# Patient Record
Sex: Male | Born: 1935 | Race: White | Hispanic: No | Marital: Married | Smoking: Former smoker
Health system: Southern US, Community
[De-identification: ages and names within clinical notes are randomized; demographics above are authoritative.]

## PROBLEM LIST (undated history)

## (undated) DIAGNOSIS — N400 Enlarged prostate without lower urinary tract symptoms: Secondary | ICD-10-CM

## (undated) DIAGNOSIS — R319 Hematuria, unspecified: Secondary | ICD-10-CM

## (undated) DIAGNOSIS — E559 Vitamin D deficiency, unspecified: Secondary | ICD-10-CM

## (undated) DIAGNOSIS — G512 Melkersson's syndrome: Secondary | ICD-10-CM

## (undated) DIAGNOSIS — C719 Malignant neoplasm of brain, unspecified: Secondary | ICD-10-CM

## (undated) DIAGNOSIS — E785 Hyperlipidemia, unspecified: Secondary | ICD-10-CM

## (undated) DIAGNOSIS — M810 Age-related osteoporosis without current pathological fracture: Secondary | ICD-10-CM

## (undated) DIAGNOSIS — I10 Essential (primary) hypertension: Secondary | ICD-10-CM

## (undated) DIAGNOSIS — G629 Polyneuropathy, unspecified: Secondary | ICD-10-CM

## (undated) DIAGNOSIS — R569 Unspecified convulsions: Secondary | ICD-10-CM

## (undated) DIAGNOSIS — C3490 Malignant neoplasm of unspecified part of unspecified bronchus or lung: Secondary | ICD-10-CM

## (undated) DIAGNOSIS — L719 Rosacea, unspecified: Secondary | ICD-10-CM

## (undated) DIAGNOSIS — D472 Monoclonal gammopathy: Secondary | ICD-10-CM

## (undated) HISTORY — PX: CATARACT EXTRACTION: SUR2

## (undated) HISTORY — DX: Unspecified convulsions: R56.9

## (undated) HISTORY — DX: Rosacea, unspecified: L71.9

## (undated) HISTORY — DX: Essential (primary) hypertension: I10

## (undated) HISTORY — DX: Malignant neoplasm of unspecified part of unspecified bronchus or lung: C34.90

## (undated) HISTORY — DX: Monoclonal gammopathy: D47.2

## (undated) HISTORY — DX: Polyneuropathy, unspecified: G62.9

## (undated) HISTORY — DX: Hematuria, unspecified: R31.9

## (undated) HISTORY — DX: Vitamin D deficiency, unspecified: E55.9

## (undated) HISTORY — DX: Melkersson's syndrome: G51.2

## (undated) HISTORY — DX: Hyperlipidemia, unspecified: E78.5

## (undated) HISTORY — DX: Age-related osteoporosis without current pathological fracture: M81.0

## (undated) HISTORY — DX: Malignant neoplasm of brain, unspecified: C71.9

## (undated) HISTORY — DX: Benign prostatic hyperplasia without lower urinary tract symptoms: N40.0

---
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
## 1994-06-04 HISTORY — PX: OTHER SURGICAL HISTORY: SHX169

## 1998-12-08 ENCOUNTER — Ambulatory Visit (HOSPITAL_COMMUNITY): Admission: RE | Admit: 1998-12-08 | Discharge: 1998-12-08 | Payer: Self-pay | Admitting: Neurosurgery

## 1998-12-08 ENCOUNTER — Encounter: Payer: Self-pay | Admitting: Neurosurgery

## 1999-06-07 ENCOUNTER — Encounter: Admission: RE | Admit: 1999-06-07 | Discharge: 1999-06-07 | Payer: Self-pay | Admitting: Thoracic Surgery

## 1999-06-07 ENCOUNTER — Encounter: Payer: Self-pay | Admitting: Thoracic Surgery

## 1999-12-20 ENCOUNTER — Encounter: Admission: RE | Admit: 1999-12-20 | Discharge: 1999-12-20 | Payer: Self-pay | Admitting: Thoracic Surgery

## 1999-12-20 ENCOUNTER — Encounter: Payer: Self-pay | Admitting: Thoracic Surgery

## 2000-06-26 ENCOUNTER — Encounter: Payer: Self-pay | Admitting: Thoracic Surgery

## 2000-06-26 ENCOUNTER — Encounter: Admission: RE | Admit: 2000-06-26 | Discharge: 2000-06-26 | Payer: Self-pay | Admitting: Thoracic Surgery

## 2000-12-24 ENCOUNTER — Encounter: Admission: RE | Admit: 2000-12-24 | Discharge: 2000-12-24 | Payer: Self-pay | Admitting: Thoracic Surgery

## 2000-12-24 ENCOUNTER — Encounter: Payer: Self-pay | Admitting: Thoracic Surgery

## 2001-07-02 ENCOUNTER — Encounter: Payer: Self-pay | Admitting: Thoracic Surgery

## 2001-07-02 ENCOUNTER — Encounter: Admission: RE | Admit: 2001-07-02 | Discharge: 2001-07-02 | Payer: Self-pay | Admitting: Thoracic Surgery

## 2001-12-31 ENCOUNTER — Encounter: Payer: Self-pay | Admitting: Thoracic Surgery

## 2001-12-31 ENCOUNTER — Encounter: Admission: RE | Admit: 2001-12-31 | Discharge: 2001-12-31 | Payer: Self-pay | Admitting: Thoracic Surgery

## 2002-07-03 ENCOUNTER — Encounter: Payer: Self-pay | Admitting: Thoracic Surgery

## 2002-07-03 ENCOUNTER — Encounter: Admission: RE | Admit: 2002-07-03 | Discharge: 2002-07-03 | Payer: Self-pay | Admitting: Thoracic Surgery

## 2005-04-05 HISTORY — PX: LUNG SURGERY: SHX703

## 2011-06-08 DIAGNOSIS — L259 Unspecified contact dermatitis, unspecified cause: Secondary | ICD-10-CM | POA: Diagnosis not present

## 2011-07-06 DIAGNOSIS — L259 Unspecified contact dermatitis, unspecified cause: Secondary | ICD-10-CM | POA: Diagnosis not present

## 2011-08-03 DIAGNOSIS — L259 Unspecified contact dermatitis, unspecified cause: Secondary | ICD-10-CM | POA: Diagnosis not present

## 2011-08-30 DIAGNOSIS — L259 Unspecified contact dermatitis, unspecified cause: Secondary | ICD-10-CM | POA: Diagnosis not present

## 2011-08-30 DIAGNOSIS — L820 Inflamed seborrheic keratosis: Secondary | ICD-10-CM | POA: Diagnosis not present

## 2011-09-19 DIAGNOSIS — Z961 Presence of intraocular lens: Secondary | ICD-10-CM | POA: Diagnosis not present

## 2011-09-19 DIAGNOSIS — D3120 Benign neoplasm of unspecified retina: Secondary | ICD-10-CM | POA: Diagnosis not present

## 2011-09-19 DIAGNOSIS — H02429 Myogenic ptosis of unspecified eyelid: Secondary | ICD-10-CM | POA: Diagnosis not present

## 2011-09-19 DIAGNOSIS — H04229 Epiphora due to insufficient drainage, unspecified lacrimal gland: Secondary | ICD-10-CM | POA: Diagnosis not present

## 2011-09-28 DIAGNOSIS — L259 Unspecified contact dermatitis, unspecified cause: Secondary | ICD-10-CM | POA: Diagnosis not present

## 2011-11-02 DIAGNOSIS — L820 Inflamed seborrheic keratosis: Secondary | ICD-10-CM | POA: Diagnosis not present

## 2011-11-02 DIAGNOSIS — L259 Unspecified contact dermatitis, unspecified cause: Secondary | ICD-10-CM | POA: Diagnosis not present

## 2011-11-13 DIAGNOSIS — E785 Hyperlipidemia, unspecified: Secondary | ICD-10-CM | POA: Diagnosis not present

## 2011-11-13 DIAGNOSIS — C3490 Malignant neoplasm of unspecified part of unspecified bronchus or lung: Secondary | ICD-10-CM | POA: Diagnosis not present

## 2011-11-13 DIAGNOSIS — R05 Cough: Secondary | ICD-10-CM | POA: Diagnosis not present

## 2011-11-13 DIAGNOSIS — I10 Essential (primary) hypertension: Secondary | ICD-10-CM | POA: Diagnosis not present

## 2011-11-19 DIAGNOSIS — Z01818 Encounter for other preprocedural examination: Secondary | ICD-10-CM | POA: Diagnosis not present

## 2011-11-20 DIAGNOSIS — C3490 Malignant neoplasm of unspecified part of unspecified bronchus or lung: Secondary | ICD-10-CM | POA: Diagnosis not present

## 2011-11-20 DIAGNOSIS — R911 Solitary pulmonary nodule: Secondary | ICD-10-CM | POA: Diagnosis not present

## 2011-11-29 DIAGNOSIS — L241 Irritant contact dermatitis due to oils and greases: Secondary | ICD-10-CM | POA: Diagnosis not present

## 2011-12-21 DIAGNOSIS — L259 Unspecified contact dermatitis, unspecified cause: Secondary | ICD-10-CM | POA: Diagnosis not present

## 2012-01-18 DIAGNOSIS — L259 Unspecified contact dermatitis, unspecified cause: Secondary | ICD-10-CM | POA: Diagnosis not present

## 2012-02-05 DIAGNOSIS — J069 Acute upper respiratory infection, unspecified: Secondary | ICD-10-CM | POA: Diagnosis not present

## 2012-03-05 DIAGNOSIS — Z23 Encounter for immunization: Secondary | ICD-10-CM | POA: Diagnosis not present

## 2012-03-14 DIAGNOSIS — L259 Unspecified contact dermatitis, unspecified cause: Secondary | ICD-10-CM | POA: Diagnosis not present

## 2012-03-19 DIAGNOSIS — H04229 Epiphora due to insufficient drainage, unspecified lacrimal gland: Secondary | ICD-10-CM | POA: Diagnosis not present

## 2012-03-19 DIAGNOSIS — H02429 Myogenic ptosis of unspecified eyelid: Secondary | ICD-10-CM | POA: Diagnosis not present

## 2012-03-19 DIAGNOSIS — Z961 Presence of intraocular lens: Secondary | ICD-10-CM | POA: Diagnosis not present

## 2012-04-11 DIAGNOSIS — L259 Unspecified contact dermatitis, unspecified cause: Secondary | ICD-10-CM | POA: Diagnosis not present

## 2012-04-11 DIAGNOSIS — L820 Inflamed seborrheic keratosis: Secondary | ICD-10-CM | POA: Diagnosis not present

## 2012-05-09 DIAGNOSIS — Z85828 Personal history of other malignant neoplasm of skin: Secondary | ICD-10-CM | POA: Diagnosis not present

## 2012-05-09 DIAGNOSIS — L259 Unspecified contact dermatitis, unspecified cause: Secondary | ICD-10-CM | POA: Diagnosis not present

## 2012-05-20 DIAGNOSIS — G40909 Epilepsy, unspecified, not intractable, without status epilepticus: Secondary | ICD-10-CM | POA: Diagnosis not present

## 2012-05-20 DIAGNOSIS — R319 Hematuria, unspecified: Secondary | ICD-10-CM | POA: Diagnosis not present

## 2012-05-20 DIAGNOSIS — E785 Hyperlipidemia, unspecified: Secondary | ICD-10-CM | POA: Diagnosis not present

## 2012-05-20 DIAGNOSIS — C3490 Malignant neoplasm of unspecified part of unspecified bronchus or lung: Secondary | ICD-10-CM | POA: Diagnosis not present

## 2012-05-20 DIAGNOSIS — I10 Essential (primary) hypertension: Secondary | ICD-10-CM | POA: Diagnosis not present

## 2012-05-20 DIAGNOSIS — Z79899 Other long term (current) drug therapy: Secondary | ICD-10-CM | POA: Diagnosis not present

## 2012-06-06 DIAGNOSIS — Z85828 Personal history of other malignant neoplasm of skin: Secondary | ICD-10-CM | POA: Diagnosis not present

## 2012-06-06 DIAGNOSIS — L259 Unspecified contact dermatitis, unspecified cause: Secondary | ICD-10-CM | POA: Diagnosis not present

## 2012-07-04 DIAGNOSIS — Z85828 Personal history of other malignant neoplasm of skin: Secondary | ICD-10-CM | POA: Diagnosis not present

## 2012-07-04 DIAGNOSIS — L259 Unspecified contact dermatitis, unspecified cause: Secondary | ICD-10-CM | POA: Diagnosis not present

## 2012-08-01 DIAGNOSIS — Z85828 Personal history of other malignant neoplasm of skin: Secondary | ICD-10-CM | POA: Diagnosis not present

## 2012-08-29 DIAGNOSIS — Z85828 Personal history of other malignant neoplasm of skin: Secondary | ICD-10-CM | POA: Diagnosis not present

## 2012-08-29 DIAGNOSIS — L259 Unspecified contact dermatitis, unspecified cause: Secondary | ICD-10-CM | POA: Diagnosis not present

## 2012-09-10 DIAGNOSIS — D3120 Benign neoplasm of unspecified retina: Secondary | ICD-10-CM | POA: Diagnosis not present

## 2012-09-10 DIAGNOSIS — H04229 Epiphora due to insufficient drainage, unspecified lacrimal gland: Secondary | ICD-10-CM | POA: Diagnosis not present

## 2012-09-10 DIAGNOSIS — H02429 Myogenic ptosis of unspecified eyelid: Secondary | ICD-10-CM | POA: Diagnosis not present

## 2012-09-10 DIAGNOSIS — Z961 Presence of intraocular lens: Secondary | ICD-10-CM | POA: Diagnosis not present

## 2012-09-26 DIAGNOSIS — L259 Unspecified contact dermatitis, unspecified cause: Secondary | ICD-10-CM | POA: Diagnosis not present

## 2012-09-26 DIAGNOSIS — Z85828 Personal history of other malignant neoplasm of skin: Secondary | ICD-10-CM | POA: Diagnosis not present

## 2012-10-24 DIAGNOSIS — L259 Unspecified contact dermatitis, unspecified cause: Secondary | ICD-10-CM | POA: Diagnosis not present

## 2012-10-24 DIAGNOSIS — Z85828 Personal history of other malignant neoplasm of skin: Secondary | ICD-10-CM | POA: Diagnosis not present

## 2012-11-21 DIAGNOSIS — Z85828 Personal history of other malignant neoplasm of skin: Secondary | ICD-10-CM | POA: Diagnosis not present

## 2012-11-21 DIAGNOSIS — L259 Unspecified contact dermatitis, unspecified cause: Secondary | ICD-10-CM | POA: Diagnosis not present

## 2012-11-25 DIAGNOSIS — G40909 Epilepsy, unspecified, not intractable, without status epilepticus: Secondary | ICD-10-CM | POA: Diagnosis not present

## 2012-11-25 DIAGNOSIS — I2510 Atherosclerotic heart disease of native coronary artery without angina pectoris: Secondary | ICD-10-CM | POA: Diagnosis not present

## 2012-11-25 DIAGNOSIS — I10 Essential (primary) hypertension: Secondary | ICD-10-CM | POA: Diagnosis not present

## 2012-11-25 DIAGNOSIS — E785 Hyperlipidemia, unspecified: Secondary | ICD-10-CM | POA: Diagnosis not present

## 2012-12-01 DIAGNOSIS — E7800 Pure hypercholesterolemia, unspecified: Secondary | ICD-10-CM | POA: Diagnosis not present

## 2012-12-01 DIAGNOSIS — M1990 Unspecified osteoarthritis, unspecified site: Secondary | ICD-10-CM | POA: Diagnosis not present

## 2012-12-01 DIAGNOSIS — Z7982 Long term (current) use of aspirin: Secondary | ICD-10-CM | POA: Diagnosis not present

## 2012-12-01 DIAGNOSIS — Z87891 Personal history of nicotine dependence: Secondary | ICD-10-CM | POA: Diagnosis not present

## 2012-12-01 DIAGNOSIS — R58 Hemorrhage, not elsewhere classified: Secondary | ICD-10-CM | POA: Diagnosis not present

## 2012-12-01 DIAGNOSIS — Z85841 Personal history of malignant neoplasm of brain: Secondary | ICD-10-CM | POA: Diagnosis not present

## 2012-12-01 DIAGNOSIS — I8390 Asymptomatic varicose veins of unspecified lower extremity: Secondary | ICD-10-CM | POA: Diagnosis not present

## 2012-12-01 DIAGNOSIS — M810 Age-related osteoporosis without current pathological fracture: Secondary | ICD-10-CM | POA: Diagnosis not present

## 2012-12-01 DIAGNOSIS — I83893 Varicose veins of bilateral lower extremities with other complications: Secondary | ICD-10-CM | POA: Diagnosis not present

## 2012-12-01 DIAGNOSIS — I10 Essential (primary) hypertension: Secondary | ICD-10-CM | POA: Diagnosis not present

## 2012-12-01 DIAGNOSIS — Z85118 Personal history of other malignant neoplasm of bronchus and lung: Secondary | ICD-10-CM | POA: Diagnosis not present

## 2012-12-19 DIAGNOSIS — Z85828 Personal history of other malignant neoplasm of skin: Secondary | ICD-10-CM | POA: Diagnosis not present

## 2012-12-19 DIAGNOSIS — L259 Unspecified contact dermatitis, unspecified cause: Secondary | ICD-10-CM | POA: Diagnosis not present

## 2013-03-03 DIAGNOSIS — Z23 Encounter for immunization: Secondary | ICD-10-CM | POA: Diagnosis not present

## 2013-03-12 DIAGNOSIS — Z961 Presence of intraocular lens: Secondary | ICD-10-CM | POA: Diagnosis not present

## 2013-03-12 DIAGNOSIS — D3120 Benign neoplasm of unspecified retina: Secondary | ICD-10-CM | POA: Diagnosis not present

## 2013-03-12 DIAGNOSIS — H04229 Epiphora due to insufficient drainage, unspecified lacrimal gland: Secondary | ICD-10-CM | POA: Diagnosis not present

## 2013-03-12 DIAGNOSIS — H02429 Myogenic ptosis of unspecified eyelid: Secondary | ICD-10-CM | POA: Diagnosis not present

## 2013-03-13 DIAGNOSIS — Z85828 Personal history of other malignant neoplasm of skin: Secondary | ICD-10-CM | POA: Diagnosis not present

## 2013-03-13 DIAGNOSIS — L259 Unspecified contact dermatitis, unspecified cause: Secondary | ICD-10-CM | POA: Diagnosis not present

## 2013-06-05 DIAGNOSIS — Z85828 Personal history of other malignant neoplasm of skin: Secondary | ICD-10-CM | POA: Diagnosis not present

## 2013-06-05 DIAGNOSIS — L259 Unspecified contact dermatitis, unspecified cause: Secondary | ICD-10-CM | POA: Diagnosis not present

## 2013-06-08 DIAGNOSIS — G40909 Epilepsy, unspecified, not intractable, without status epilepticus: Secondary | ICD-10-CM | POA: Diagnosis not present

## 2013-06-08 DIAGNOSIS — E785 Hyperlipidemia, unspecified: Secondary | ICD-10-CM | POA: Diagnosis not present

## 2013-06-08 DIAGNOSIS — Z79899 Other long term (current) drug therapy: Secondary | ICD-10-CM | POA: Diagnosis not present

## 2013-06-09 DIAGNOSIS — E785 Hyperlipidemia, unspecified: Secondary | ICD-10-CM | POA: Diagnosis not present

## 2013-06-09 DIAGNOSIS — I10 Essential (primary) hypertension: Secondary | ICD-10-CM | POA: Diagnosis not present

## 2013-08-26 DIAGNOSIS — R079 Chest pain, unspecified: Secondary | ICD-10-CM | POA: Diagnosis not present

## 2013-08-26 DIAGNOSIS — M1710 Unilateral primary osteoarthritis, unspecified knee: Secondary | ICD-10-CM | POA: Diagnosis not present

## 2013-08-26 DIAGNOSIS — R0609 Other forms of dyspnea: Secondary | ICD-10-CM | POA: Diagnosis not present

## 2013-08-26 DIAGNOSIS — IMO0002 Reserved for concepts with insufficient information to code with codable children: Secondary | ICD-10-CM | POA: Diagnosis not present

## 2013-08-26 DIAGNOSIS — M25569 Pain in unspecified knee: Secondary | ICD-10-CM | POA: Diagnosis not present

## 2013-09-09 DIAGNOSIS — M25569 Pain in unspecified knee: Secondary | ICD-10-CM | POA: Diagnosis not present

## 2013-09-14 DIAGNOSIS — D3120 Benign neoplasm of unspecified retina: Secondary | ICD-10-CM | POA: Diagnosis not present

## 2013-09-14 DIAGNOSIS — Z961 Presence of intraocular lens: Secondary | ICD-10-CM | POA: Diagnosis not present

## 2013-09-14 DIAGNOSIS — H02429 Myogenic ptosis of unspecified eyelid: Secondary | ICD-10-CM | POA: Diagnosis not present

## 2013-09-14 DIAGNOSIS — H04229 Epiphora due to insufficient drainage, unspecified lacrimal gland: Secondary | ICD-10-CM | POA: Diagnosis not present

## 2013-09-18 DIAGNOSIS — Z85828 Personal history of other malignant neoplasm of skin: Secondary | ICD-10-CM | POA: Diagnosis not present

## 2013-09-18 DIAGNOSIS — L259 Unspecified contact dermatitis, unspecified cause: Secondary | ICD-10-CM | POA: Diagnosis not present

## 2013-10-07 DIAGNOSIS — M25569 Pain in unspecified knee: Secondary | ICD-10-CM | POA: Diagnosis not present

## 2013-10-12 DIAGNOSIS — M25469 Effusion, unspecified knee: Secondary | ICD-10-CM | POA: Diagnosis not present

## 2013-10-12 DIAGNOSIS — M25569 Pain in unspecified knee: Secondary | ICD-10-CM | POA: Diagnosis not present

## 2013-10-13 DIAGNOSIS — M25569 Pain in unspecified knee: Secondary | ICD-10-CM | POA: Diagnosis not present

## 2013-10-14 DIAGNOSIS — Q72899 Other reduction defects of unspecified lower limb: Secondary | ICD-10-CM | POA: Diagnosis not present

## 2013-10-14 DIAGNOSIS — B351 Tinea unguium: Secondary | ICD-10-CM | POA: Diagnosis not present

## 2013-10-14 DIAGNOSIS — M79609 Pain in unspecified limb: Secondary | ICD-10-CM | POA: Diagnosis not present

## 2013-10-23 DIAGNOSIS — L259 Unspecified contact dermatitis, unspecified cause: Secondary | ICD-10-CM | POA: Diagnosis not present

## 2013-10-23 DIAGNOSIS — L570 Actinic keratosis: Secondary | ICD-10-CM | POA: Diagnosis not present

## 2013-10-23 DIAGNOSIS — Z85828 Personal history of other malignant neoplasm of skin: Secondary | ICD-10-CM | POA: Diagnosis not present

## 2013-11-25 DIAGNOSIS — M25569 Pain in unspecified knee: Secondary | ICD-10-CM | POA: Diagnosis not present

## 2013-12-17 DIAGNOSIS — R634 Abnormal weight loss: Secondary | ICD-10-CM | POA: Diagnosis not present

## 2013-12-17 DIAGNOSIS — Z23 Encounter for immunization: Secondary | ICD-10-CM | POA: Diagnosis not present

## 2013-12-17 DIAGNOSIS — I10 Essential (primary) hypertension: Secondary | ICD-10-CM | POA: Diagnosis not present

## 2013-12-17 DIAGNOSIS — E785 Hyperlipidemia, unspecified: Secondary | ICD-10-CM | POA: Diagnosis not present

## 2013-12-17 DIAGNOSIS — G40909 Epilepsy, unspecified, not intractable, without status epilepticus: Secondary | ICD-10-CM | POA: Diagnosis not present

## 2013-12-21 DIAGNOSIS — M79609 Pain in unspecified limb: Secondary | ICD-10-CM | POA: Diagnosis not present

## 2013-12-21 DIAGNOSIS — Q72899 Other reduction defects of unspecified lower limb: Secondary | ICD-10-CM | POA: Diagnosis not present

## 2014-01-22 DIAGNOSIS — L570 Actinic keratosis: Secondary | ICD-10-CM | POA: Diagnosis not present

## 2014-01-22 DIAGNOSIS — Z85828 Personal history of other malignant neoplasm of skin: Secondary | ICD-10-CM | POA: Diagnosis not present

## 2014-01-22 DIAGNOSIS — L259 Unspecified contact dermatitis, unspecified cause: Secondary | ICD-10-CM | POA: Diagnosis not present

## 2014-02-22 DIAGNOSIS — Q72899 Other reduction defects of unspecified lower limb: Secondary | ICD-10-CM | POA: Diagnosis not present

## 2014-02-22 DIAGNOSIS — M79609 Pain in unspecified limb: Secondary | ICD-10-CM | POA: Diagnosis not present

## 2014-03-08 DIAGNOSIS — Z23 Encounter for immunization: Secondary | ICD-10-CM | POA: Diagnosis not present

## 2014-04-03 DIAGNOSIS — C719 Malignant neoplasm of brain, unspecified: Secondary | ICD-10-CM | POA: Diagnosis not present

## 2014-04-03 DIAGNOSIS — C3490 Malignant neoplasm of unspecified part of unspecified bronchus or lung: Secondary | ICD-10-CM | POA: Diagnosis not present

## 2014-04-03 DIAGNOSIS — I10 Essential (primary) hypertension: Secondary | ICD-10-CM | POA: Diagnosis not present

## 2014-04-03 DIAGNOSIS — G40909 Epilepsy, unspecified, not intractable, without status epilepticus: Secondary | ICD-10-CM | POA: Diagnosis not present

## 2014-04-03 DIAGNOSIS — H9193 Unspecified hearing loss, bilateral: Secondary | ICD-10-CM | POA: Diagnosis not present

## 2014-04-03 DIAGNOSIS — E785 Hyperlipidemia, unspecified: Secondary | ICD-10-CM | POA: Diagnosis not present

## 2014-04-03 DIAGNOSIS — M810 Age-related osteoporosis without current pathological fracture: Secondary | ICD-10-CM | POA: Diagnosis not present

## 2014-04-06 DIAGNOSIS — Z125 Encounter for screening for malignant neoplasm of prostate: Secondary | ICD-10-CM | POA: Diagnosis not present

## 2014-04-06 DIAGNOSIS — M810 Age-related osteoporosis without current pathological fracture: Secondary | ICD-10-CM | POA: Diagnosis not present

## 2014-04-06 DIAGNOSIS — Z79899 Other long term (current) drug therapy: Secondary | ICD-10-CM | POA: Diagnosis not present

## 2014-04-06 DIAGNOSIS — E785 Hyperlipidemia, unspecified: Secondary | ICD-10-CM | POA: Diagnosis not present

## 2014-04-06 DIAGNOSIS — C3490 Malignant neoplasm of unspecified part of unspecified bronchus or lung: Secondary | ICD-10-CM | POA: Diagnosis not present

## 2014-05-07 DIAGNOSIS — Z85828 Personal history of other malignant neoplasm of skin: Secondary | ICD-10-CM | POA: Diagnosis not present

## 2014-05-07 DIAGNOSIS — L309 Dermatitis, unspecified: Secondary | ICD-10-CM | POA: Diagnosis not present

## 2014-07-09 DIAGNOSIS — Z85828 Personal history of other malignant neoplasm of skin: Secondary | ICD-10-CM | POA: Diagnosis not present

## 2014-07-09 DIAGNOSIS — L218 Other seborrheic dermatitis: Secondary | ICD-10-CM | POA: Diagnosis not present

## 2014-07-09 DIAGNOSIS — L570 Actinic keratosis: Secondary | ICD-10-CM | POA: Diagnosis not present

## 2014-09-10 DIAGNOSIS — L718 Other rosacea: Secondary | ICD-10-CM | POA: Diagnosis not present

## 2014-09-10 DIAGNOSIS — C4442 Squamous cell carcinoma of skin of scalp and neck: Secondary | ICD-10-CM | POA: Diagnosis not present

## 2014-09-10 DIAGNOSIS — Z85828 Personal history of other malignant neoplasm of skin: Secondary | ICD-10-CM | POA: Diagnosis not present

## 2014-09-10 DIAGNOSIS — D485 Neoplasm of uncertain behavior of skin: Secondary | ICD-10-CM | POA: Diagnosis not present

## 2014-10-05 DIAGNOSIS — I10 Essential (primary) hypertension: Secondary | ICD-10-CM | POA: Diagnosis not present

## 2014-10-05 DIAGNOSIS — M810 Age-related osteoporosis without current pathological fracture: Secondary | ICD-10-CM | POA: Diagnosis not present

## 2014-10-05 DIAGNOSIS — G40909 Epilepsy, unspecified, not intractable, without status epilepticus: Secondary | ICD-10-CM | POA: Diagnosis not present

## 2014-10-05 DIAGNOSIS — Z1389 Encounter for screening for other disorder: Secondary | ICD-10-CM | POA: Diagnosis not present

## 2014-10-05 DIAGNOSIS — Z9181 History of falling: Secondary | ICD-10-CM | POA: Diagnosis not present

## 2014-10-05 DIAGNOSIS — Z6827 Body mass index (BMI) 27.0-27.9, adult: Secondary | ICD-10-CM | POA: Diagnosis not present

## 2014-10-05 DIAGNOSIS — R319 Hematuria, unspecified: Secondary | ICD-10-CM | POA: Diagnosis not present

## 2014-10-05 DIAGNOSIS — G629 Polyneuropathy, unspecified: Secondary | ICD-10-CM | POA: Diagnosis not present

## 2014-10-05 DIAGNOSIS — E785 Hyperlipidemia, unspecified: Secondary | ICD-10-CM | POA: Diagnosis not present

## 2014-10-05 DIAGNOSIS — Z79899 Other long term (current) drug therapy: Secondary | ICD-10-CM | POA: Diagnosis not present

## 2014-10-05 DIAGNOSIS — C3491 Malignant neoplasm of unspecified part of right bronchus or lung: Secondary | ICD-10-CM | POA: Diagnosis not present

## 2014-10-18 DIAGNOSIS — R351 Nocturia: Secondary | ICD-10-CM | POA: Diagnosis not present

## 2014-10-18 DIAGNOSIS — R310 Gross hematuria: Secondary | ICD-10-CM | POA: Diagnosis not present

## 2014-10-18 DIAGNOSIS — N401 Enlarged prostate with lower urinary tract symptoms: Secondary | ICD-10-CM | POA: Diagnosis not present

## 2014-10-20 DIAGNOSIS — N400 Enlarged prostate without lower urinary tract symptoms: Secondary | ICD-10-CM | POA: Diagnosis not present

## 2014-10-20 DIAGNOSIS — N281 Cyst of kidney, acquired: Secondary | ICD-10-CM | POA: Diagnosis not present

## 2014-10-20 DIAGNOSIS — R310 Gross hematuria: Secondary | ICD-10-CM | POA: Diagnosis not present

## 2014-10-20 DIAGNOSIS — Z85118 Personal history of other malignant neoplasm of bronchus and lung: Secondary | ICD-10-CM | POA: Diagnosis not present

## 2014-11-02 DIAGNOSIS — R310 Gross hematuria: Secondary | ICD-10-CM | POA: Diagnosis not present

## 2014-11-02 DIAGNOSIS — N401 Enlarged prostate with lower urinary tract symptoms: Secondary | ICD-10-CM | POA: Diagnosis not present

## 2014-11-12 DIAGNOSIS — L218 Other seborrheic dermatitis: Secondary | ICD-10-CM | POA: Diagnosis not present

## 2014-11-12 DIAGNOSIS — Z85828 Personal history of other malignant neoplasm of skin: Secondary | ICD-10-CM | POA: Diagnosis not present

## 2015-01-14 DIAGNOSIS — Z85828 Personal history of other malignant neoplasm of skin: Secondary | ICD-10-CM | POA: Diagnosis not present

## 2015-01-14 DIAGNOSIS — L718 Other rosacea: Secondary | ICD-10-CM | POA: Diagnosis not present

## 2015-01-14 DIAGNOSIS — L570 Actinic keratosis: Secondary | ICD-10-CM | POA: Diagnosis not present

## 2015-02-01 DIAGNOSIS — N401 Enlarged prostate with lower urinary tract symptoms: Secondary | ICD-10-CM | POA: Diagnosis not present

## 2015-02-01 DIAGNOSIS — R351 Nocturia: Secondary | ICD-10-CM | POA: Diagnosis not present

## 2015-02-23 DIAGNOSIS — Z23 Encounter for immunization: Secondary | ICD-10-CM | POA: Diagnosis not present

## 2015-03-18 DIAGNOSIS — Z85828 Personal history of other malignant neoplasm of skin: Secondary | ICD-10-CM | POA: Diagnosis not present

## 2015-03-18 DIAGNOSIS — L249 Irritant contact dermatitis, unspecified cause: Secondary | ICD-10-CM | POA: Diagnosis not present

## 2015-03-18 DIAGNOSIS — C4441 Basal cell carcinoma of skin of scalp and neck: Secondary | ICD-10-CM | POA: Diagnosis not present

## 2015-03-18 DIAGNOSIS — D485 Neoplasm of uncertain behavior of skin: Secondary | ICD-10-CM | POA: Diagnosis not present

## 2015-03-21 DIAGNOSIS — Z961 Presence of intraocular lens: Secondary | ICD-10-CM | POA: Diagnosis not present

## 2015-04-12 DIAGNOSIS — I10 Essential (primary) hypertension: Secondary | ICD-10-CM | POA: Diagnosis not present

## 2015-04-12 DIAGNOSIS — M810 Age-related osteoporosis without current pathological fracture: Secondary | ICD-10-CM | POA: Diagnosis not present

## 2015-04-12 DIAGNOSIS — E559 Vitamin D deficiency, unspecified: Secondary | ICD-10-CM | POA: Diagnosis not present

## 2015-04-12 DIAGNOSIS — G40909 Epilepsy, unspecified, not intractable, without status epilepticus: Secondary | ICD-10-CM | POA: Diagnosis not present

## 2015-04-12 DIAGNOSIS — J340 Abscess, furuncle and carbuncle of nose: Secondary | ICD-10-CM | POA: Diagnosis not present

## 2015-04-12 DIAGNOSIS — Z79899 Other long term (current) drug therapy: Secondary | ICD-10-CM | POA: Diagnosis not present

## 2015-04-12 DIAGNOSIS — E785 Hyperlipidemia, unspecified: Secondary | ICD-10-CM | POA: Diagnosis not present

## 2015-04-12 DIAGNOSIS — Z9181 History of falling: Secondary | ICD-10-CM | POA: Diagnosis not present

## 2015-04-12 DIAGNOSIS — Z125 Encounter for screening for malignant neoplasm of prostate: Secondary | ICD-10-CM | POA: Diagnosis not present

## 2015-04-12 DIAGNOSIS — Z1389 Encounter for screening for other disorder: Secondary | ICD-10-CM | POA: Diagnosis not present

## 2015-04-12 DIAGNOSIS — G629 Polyneuropathy, unspecified: Secondary | ICD-10-CM | POA: Diagnosis not present

## 2015-04-12 DIAGNOSIS — C3491 Malignant neoplasm of unspecified part of right bronchus or lung: Secondary | ICD-10-CM | POA: Diagnosis not present

## 2015-05-20 DIAGNOSIS — L570 Actinic keratosis: Secondary | ICD-10-CM | POA: Diagnosis not present

## 2015-05-20 DIAGNOSIS — Z85828 Personal history of other malignant neoplasm of skin: Secondary | ICD-10-CM | POA: Diagnosis not present

## 2015-05-20 DIAGNOSIS — L308 Other specified dermatitis: Secondary | ICD-10-CM | POA: Diagnosis not present

## 2015-07-22 DIAGNOSIS — L570 Actinic keratosis: Secondary | ICD-10-CM | POA: Diagnosis not present

## 2015-07-22 DIAGNOSIS — L718 Other rosacea: Secondary | ICD-10-CM | POA: Diagnosis not present

## 2015-07-22 DIAGNOSIS — Z85828 Personal history of other malignant neoplasm of skin: Secondary | ICD-10-CM | POA: Diagnosis not present

## 2015-08-17 DIAGNOSIS — R351 Nocturia: Secondary | ICD-10-CM | POA: Diagnosis not present

## 2015-08-17 DIAGNOSIS — Z125 Encounter for screening for malignant neoplasm of prostate: Secondary | ICD-10-CM | POA: Diagnosis not present

## 2015-08-17 DIAGNOSIS — N401 Enlarged prostate with lower urinary tract symptoms: Secondary | ICD-10-CM | POA: Diagnosis not present

## 2015-09-30 DIAGNOSIS — L308 Other specified dermatitis: Secondary | ICD-10-CM | POA: Diagnosis not present

## 2015-09-30 DIAGNOSIS — Z85828 Personal history of other malignant neoplasm of skin: Secondary | ICD-10-CM | POA: Diagnosis not present

## 2015-10-13 DIAGNOSIS — I10 Essential (primary) hypertension: Secondary | ICD-10-CM | POA: Diagnosis not present

## 2015-10-13 DIAGNOSIS — Z6826 Body mass index (BMI) 26.0-26.9, adult: Secondary | ICD-10-CM | POA: Diagnosis not present

## 2015-10-13 DIAGNOSIS — E559 Vitamin D deficiency, unspecified: Secondary | ICD-10-CM | POA: Diagnosis not present

## 2015-10-13 DIAGNOSIS — C3491 Malignant neoplasm of unspecified part of right bronchus or lung: Secondary | ICD-10-CM | POA: Diagnosis not present

## 2015-10-13 DIAGNOSIS — M8589 Other specified disorders of bone density and structure, multiple sites: Secondary | ICD-10-CM | POA: Diagnosis not present

## 2015-10-13 DIAGNOSIS — E785 Hyperlipidemia, unspecified: Secondary | ICD-10-CM | POA: Diagnosis not present

## 2015-10-13 DIAGNOSIS — G40909 Epilepsy, unspecified, not intractable, without status epilepticus: Secondary | ICD-10-CM | POA: Diagnosis not present

## 2015-10-13 DIAGNOSIS — M810 Age-related osteoporosis without current pathological fracture: Secondary | ICD-10-CM | POA: Diagnosis not present

## 2015-10-13 DIAGNOSIS — E663 Overweight: Secondary | ICD-10-CM | POA: Diagnosis not present

## 2015-10-13 DIAGNOSIS — M546 Pain in thoracic spine: Secondary | ICD-10-CM | POA: Diagnosis not present

## 2015-10-13 DIAGNOSIS — M40294 Other kyphosis, thoracic region: Secondary | ICD-10-CM | POA: Diagnosis not present

## 2015-10-13 DIAGNOSIS — G629 Polyneuropathy, unspecified: Secondary | ICD-10-CM | POA: Diagnosis not present

## 2015-10-13 DIAGNOSIS — Z79899 Other long term (current) drug therapy: Secondary | ICD-10-CM | POA: Diagnosis not present

## 2015-10-18 DIAGNOSIS — C3491 Malignant neoplasm of unspecified part of right bronchus or lung: Secondary | ICD-10-CM | POA: Diagnosis not present

## 2015-10-18 DIAGNOSIS — R918 Other nonspecific abnormal finding of lung field: Secondary | ICD-10-CM | POA: Diagnosis not present

## 2015-10-26 DIAGNOSIS — R918 Other nonspecific abnormal finding of lung field: Secondary | ICD-10-CM | POA: Diagnosis not present

## 2015-10-26 DIAGNOSIS — C3490 Malignant neoplasm of unspecified part of unspecified bronchus or lung: Secondary | ICD-10-CM | POA: Diagnosis not present

## 2015-10-26 DIAGNOSIS — C7931 Secondary malignant neoplasm of brain: Secondary | ICD-10-CM | POA: Diagnosis not present

## 2015-10-27 ENCOUNTER — Other Ambulatory Visit: Payer: Self-pay

## 2015-10-27 DIAGNOSIS — E785 Hyperlipidemia, unspecified: Secondary | ICD-10-CM | POA: Insufficient documentation

## 2015-10-27 DIAGNOSIS — G629 Polyneuropathy, unspecified: Secondary | ICD-10-CM | POA: Insufficient documentation

## 2015-10-27 DIAGNOSIS — L719 Rosacea, unspecified: Secondary | ICD-10-CM | POA: Insufficient documentation

## 2015-10-27 DIAGNOSIS — D472 Monoclonal gammopathy: Secondary | ICD-10-CM | POA: Insufficient documentation

## 2015-10-27 DIAGNOSIS — D126 Benign neoplasm of colon, unspecified: Secondary | ICD-10-CM | POA: Insufficient documentation

## 2015-10-27 DIAGNOSIS — E559 Vitamin D deficiency, unspecified: Secondary | ICD-10-CM | POA: Insufficient documentation

## 2015-10-27 DIAGNOSIS — N401 Enlarged prostate with lower urinary tract symptoms: Secondary | ICD-10-CM | POA: Insufficient documentation

## 2015-10-27 DIAGNOSIS — C719 Malignant neoplasm of brain, unspecified: Secondary | ICD-10-CM | POA: Insufficient documentation

## 2015-10-27 DIAGNOSIS — M810 Age-related osteoporosis without current pathological fracture: Secondary | ICD-10-CM | POA: Insufficient documentation

## 2015-10-27 DIAGNOSIS — R310 Gross hematuria: Secondary | ICD-10-CM | POA: Insufficient documentation

## 2015-10-27 DIAGNOSIS — G512 Melkersson's syndrome: Secondary | ICD-10-CM | POA: Insufficient documentation

## 2015-10-27 DIAGNOSIS — C3491 Malignant neoplasm of unspecified part of right bronchus or lung: Secondary | ICD-10-CM | POA: Insufficient documentation

## 2015-10-27 DIAGNOSIS — G40909 Epilepsy, unspecified, not intractable, without status epilepticus: Secondary | ICD-10-CM | POA: Insufficient documentation

## 2015-10-28 ENCOUNTER — Ambulatory Visit (INDEPENDENT_AMBULATORY_CARE_PROVIDER_SITE_OTHER): Payer: Medicare Other | Admitting: Pulmonary Disease

## 2015-10-28 ENCOUNTER — Encounter: Payer: Self-pay | Admitting: Pulmonary Disease

## 2015-10-28 VITALS — BP 110/60 | HR 68 | Temp 97.1°F | Ht 70.0 in | Wt 178.6 lb

## 2015-10-28 DIAGNOSIS — R911 Solitary pulmonary nodule: Secondary | ICD-10-CM | POA: Diagnosis not present

## 2015-10-28 DIAGNOSIS — M810 Age-related osteoporosis without current pathological fracture: Secondary | ICD-10-CM | POA: Diagnosis not present

## 2015-10-28 NOTE — Patient Instructions (Signed)
Will schedule CT chest for September 2107  Follow up after CT chest in September 2017

## 2015-10-28 NOTE — Progress Notes (Signed)
Past surgical history He  has past surgical history that includes Cataract extraction (Bilateral); Appendectomy (1939); brain tumor removal (1996); and Lung surgery (04/05/05).  Family history His family history includes ALS in his father; Heart attack in his sister.  Social history He  reports that he quit smoking about 21 years ago. His smoking use included Cigarettes. He has a 60 pack-year smoking history. He does not have any smokeless tobacco history on file. He reports that he does not drink alcohol or use illicit drugs.  No Known Allergies  Current Outpatient Prescriptions on File Prior to Visit  Medication Sig  . alendronate (FOSAMAX) 70 MG tablet Take 1 tablet (70 mg total) by mouth once a week. Take with a full glass of water on an empty stomach.  . bisoprolol-hydrochlorothiazide (ZIAC) 10-6.25 MG tablet Take 1 tablet by mouth daily.  . calcium carbonate (OS-CAL) 600 MG tablet Take 2 tablets (1,200 mg total) by mouth daily.  . Cholecalciferol (VITAMIN D3 MAXIMUM STRENGTH) 5000 units capsule Take 1 capsule (5,000 Units total) by mouth daily.  Marland Kitchen gabapentin (NEURONTIN) 100 MG capsule Take 1 capsule (100 mg total) by mouth 3 (three) times daily.  Marland Kitchen losartan (COZAAR) 50 MG tablet Take 1 tablet (50 mg total) by mouth daily.  . Multiple Vitamins-Minerals (MULTIVITAMIN) tablet Take 1 tablet by mouth daily.  . phenytoin (DILANTIN) 100 MG ER capsule Take 1 capsule (100 mg total) by mouth 3 (three) times daily.  . simvastatin (ZOCOR) 80 MG tablet Take 1 tablet (80 mg total) by mouth at bedtime.   No current facility-administered medications on file prior to visit.    Chief Complaint  Patient presents with  . Advice Only    referred by Dr. Carolanne Grumbling; CT scan abnormal; PET scan shows spot    Tests CT chest 10/14/15 >> 3.8 cm Lt lung opacity, pleural calcifications, centrilobular emphysema PET scan 10/26/15 >> low activity LUL nodular opacity, low activity RML opacity  Past medical  history He  has a past medical history of Osteoporosis; Vitamin D deficiency; Hyperlipidemia; Seizure ([place]); Non-small cell lung cancer ([place]); Peripheral neuropathy ([place]); Hypertension; MGUS (monoclonal gammopathy of unknown significance); Melkersson-Rosenthal syndrome; Rosacea; Hematuria; BPH (benign prostatic hyperplasia); and Brain cancer ([place]).  Vital signs BP 110/60 mmHg  Pulse 68  Temp(Src) 97.1 F (36.2 C) (Oral)  Ht '5\' 10"'$  (1.778 m)  Wt 178 lb 9.6 oz (81.012 kg)  BMI 25.63 kg/m2  SpO2 96%  History of present illness Jay Warren is a 82 y.o. male with former smoker with abnormal CT chest.  He has history of lung cancer and had surgery.  He quit smoking 20 years ago.  He denies occupational exposures, history of pneumonia, or history of tuberculosis.  He developed pain in his back.  He had spine xray that was unrevealing.  He then had chest xray >> again unrevealing.  He had CT chest which showed opacity in left lung.  He had PET scan that showed minimal uptake.  He was started on antibiotic by PCP >> hasn't notice any difference.  He denies cough, wheeze, fever, sweats, hemoptysis, skin rash, gland swelling, or chest pain.  His main complaint is back pain.  This is better when he sits down and worse when he stands up.   Physical exam  General - No distress ENT - No sinus tenderness, no oral exudate, no LAN, no thyromegaly, TM clear, pupils equal/reactive Cardiac - s1s2 regular, no murmur, pulses symmetric Chest - No wheeze/rales/dullness, good air entry,  normal respiratory excursion Back - kyphoscoliosis Abd - Soft, non-tender, no organomegaly, + bowel sounds Ext - No edema Neuro - Normal strength, cranial nerves intact Skin - No rashes Psych - Normal mood, and behavior   Discussion 82 yo male former smoker you was found to have incidental lesions as detailed above on CT chest.  These showed very minimal activity on PET scan.  He has extensive history of prior  malignancy.  He does not have any other significant respiratory symptoms.  Explained these findings could be related to benign lesions or slow growing malignant process with low uptake on PET scan.  Assessment/plan  Left lung opacity with low grade uptake on PET scan. - f/u CT chest without contrast in September 2017  Pleural calcifications >> no signifcant occupational exposures. - monitor radiographically  Centrilobular emphysema on CT chest >> no significant symptoms. - monitor clinically   Patient Instructions  Will schedule CT chest for September 2107  Follow up after CT chest in September 2017     Chesley Mires, MD [facility] Pager:  [phone] 10/28/2015, 4:53 PM

## 2015-11-24 DIAGNOSIS — J849 Interstitial pulmonary disease, unspecified: Secondary | ICD-10-CM | POA: Diagnosis not present

## 2015-11-24 DIAGNOSIS — J189 Pneumonia, unspecified organism: Secondary | ICD-10-CM | POA: Diagnosis not present

## 2015-12-02 DIAGNOSIS — L308 Other specified dermatitis: Secondary | ICD-10-CM | POA: Diagnosis not present

## 2015-12-02 DIAGNOSIS — D485 Neoplasm of uncertain behavior of skin: Secondary | ICD-10-CM | POA: Diagnosis not present

## 2015-12-02 DIAGNOSIS — L820 Inflamed seborrheic keratosis: Secondary | ICD-10-CM | POA: Diagnosis not present

## 2015-12-02 DIAGNOSIS — Z85828 Personal history of other malignant neoplasm of skin: Secondary | ICD-10-CM | POA: Diagnosis not present

## 2016-01-27 DIAGNOSIS — Z85828 Personal history of other malignant neoplasm of skin: Secondary | ICD-10-CM | POA: Diagnosis not present

## 2016-01-27 DIAGNOSIS — L718 Other rosacea: Secondary | ICD-10-CM | POA: Diagnosis not present

## 2016-02-17 DIAGNOSIS — Z23 Encounter for immunization: Secondary | ICD-10-CM | POA: Diagnosis not present

## 2016-02-17 DIAGNOSIS — N401 Enlarged prostate with lower urinary tract symptoms: Secondary | ICD-10-CM | POA: Diagnosis not present

## 2016-02-17 DIAGNOSIS — R351 Nocturia: Secondary | ICD-10-CM | POA: Diagnosis not present

## 2016-02-23 ENCOUNTER — Ambulatory Visit (INDEPENDENT_AMBULATORY_CARE_PROVIDER_SITE_OTHER)
Admission: RE | Admit: 2016-02-23 | Discharge: 2016-02-23 | Disposition: A | Discharge status: Home / Self Care | Payer: Medicare Other | Source: Ambulatory Visit | Attending: Pulmonary Disease | Admitting: Pulmonary Disease

## 2016-02-23 DIAGNOSIS — R911 Solitary pulmonary nodule: Secondary | ICD-10-CM

## 2016-02-27 ENCOUNTER — Encounter: Payer: Self-pay | Admitting: Pulmonary Disease

## 2016-02-27 ENCOUNTER — Ambulatory Visit (INDEPENDENT_AMBULATORY_CARE_PROVIDER_SITE_OTHER): Payer: Medicare Other | Admitting: Pulmonary Disease

## 2016-02-27 VITALS — BP 132/68 | HR 60 | Ht 69.0 in | Wt 176.8 lb

## 2016-02-27 DIAGNOSIS — R911 Solitary pulmonary nodule: Secondary | ICD-10-CM | POA: Diagnosis not present

## 2016-02-27 NOTE — Progress Notes (Signed)
  Current Outpatient Prescriptions on File Prior to Visit  Medication Sig  . alendronate (FOSAMAX) 70 MG tablet Take 1 tablet (70 mg total) by mouth once a week. Take with a full glass of water on an empty stomach.  . bisoprolol-hydrochlorothiazide (ZIAC) 10-6.25 MG tablet Take 1 tablet by mouth daily.  . calcium carbonate (OS-CAL) 600 MG tablet Take 2 tablets (1,200 mg total) by mouth daily.  . Cholecalciferol (VITAMIN D3 MAXIMUM STRENGTH) 5000 units capsule Take 1 capsule (5,000 Units total) by mouth daily.  Marland Kitchen gabapentin (NEURONTIN) 100 MG capsule Take 1 capsule (100 mg total) by mouth 3 (three) times daily.  Marland Kitchen losartan (COZAAR) 50 MG tablet Take 1 tablet (50 mg total) by mouth daily.  . Multiple Vitamins-Minerals (MULTIVITAMIN) tablet Take 1 tablet by mouth daily.  . phenytoin (DILANTIN) 100 MG ER capsule Take 1 capsule (100 mg total) by mouth 3 (three) times daily.  . simvastatin (ZOCOR) 80 MG tablet Take 1 tablet (80 mg total) by mouth at bedtime.   No current facility-administered medications on file prior to visit.     Chief Complaint  Patient presents with  . Follow-up    Review CT results. Denies any current breathing issues.     Tests CT chest 10/14/15 >> 3.8 cm Lt lung opacity, pleural calcifications, centrilobular emphysema PET scan 10/26/15 >> low activity LUL nodular opacity, low activity RML opacity CT chest 02/23/16 >> no change in nodular opacities LUL and RML  Past medical history BPH, Brain cancer, MGUS, Melkersson Rosenthal syndrome, HLD, HTN, NSCLC s/p RULectomy and XRT, Osteoporosis, Peripheral neuropathy, Seizures  Vital signs BP 132/68 (BP Location: Left Arm, Cuff Size: Normal)   Pulse 60   Ht '5\' 9"'$  (1.753 m)   Wt 176 lb 12.8 oz (80.2 kg)   SpO2 93%   BMI 26.11 kg/m   History of present illness Jay Warren is a 82 y.o. male with former smoker with abnormal CT chest.  He is here to review his CT chest from earlier this month >> no change.  He denies  cough, sputum, hemoptysis, fever, chest pain, wheeze, sweats, or weight loss.   Physical exam  General - No distress ENT - No sinus tenderness, no oral exudate, no LAN, no thyromegaly, TM clear Cardiac - s1s2 regular, no murmur Chest - No wheeze/rales/dullness Back - kyphoscoliosis Abd - Soft, non-tender Ext - No edema Neuro - Normal strength Skin - No rashes Psych - Normal mood, and behavior   Assessment/plan  Left upper lobe and right middle lobe lung opacities stable on CT chest. - most likely scar tissue from previous XRT therapy - less likely infection/inflammation/recurrent malignancy - he would like to monitor clinically, and only consider f/u CT imaging if his symptoms get worse  Centrilobular emphysema on CT chest - no significant symptoms - monitor clinically   Patient Instructions  Follow up as needed    Chesley Mires, MD [facility]Critical [facility]/Sleep Pager:  [phone] 02/27/2016, 12:40 PM

## 2016-02-27 NOTE — Patient Instructions (Signed)
Follow up as needed

## 2016-03-16 DIAGNOSIS — L308 Other specified dermatitis: Secondary | ICD-10-CM | POA: Diagnosis not present

## 2016-03-16 DIAGNOSIS — C44612 Basal cell carcinoma of skin of right upper limb, including shoulder: Secondary | ICD-10-CM | POA: Diagnosis not present

## 2016-03-16 DIAGNOSIS — D485 Neoplasm of uncertain behavior of skin: Secondary | ICD-10-CM | POA: Diagnosis not present

## 2016-03-16 DIAGNOSIS — L718 Other rosacea: Secondary | ICD-10-CM | POA: Diagnosis not present

## 2016-03-16 DIAGNOSIS — Z85828 Personal history of other malignant neoplasm of skin: Secondary | ICD-10-CM | POA: Diagnosis not present

## 2016-04-19 DIAGNOSIS — Z79899 Other long term (current) drug therapy: Secondary | ICD-10-CM | POA: Diagnosis not present

## 2016-04-19 DIAGNOSIS — Z9181 History of falling: Secondary | ICD-10-CM | POA: Diagnosis not present

## 2016-04-19 DIAGNOSIS — Z6825 Body mass index (BMI) 25.0-25.9, adult: Secondary | ICD-10-CM | POA: Diagnosis not present

## 2016-04-19 DIAGNOSIS — Z1389 Encounter for screening for other disorder: Secondary | ICD-10-CM | POA: Diagnosis not present

## 2016-04-19 DIAGNOSIS — L602 Onychogryphosis: Secondary | ICD-10-CM | POA: Diagnosis not present

## 2016-04-19 DIAGNOSIS — E559 Vitamin D deficiency, unspecified: Secondary | ICD-10-CM | POA: Diagnosis not present

## 2016-04-19 DIAGNOSIS — M810 Age-related osteoporosis without current pathological fracture: Secondary | ICD-10-CM | POA: Diagnosis not present

## 2016-04-19 DIAGNOSIS — C3491 Malignant neoplasm of unspecified part of right bronchus or lung: Secondary | ICD-10-CM | POA: Diagnosis not present

## 2016-04-19 DIAGNOSIS — I10 Essential (primary) hypertension: Secondary | ICD-10-CM | POA: Diagnosis not present

## 2016-04-19 DIAGNOSIS — E785 Hyperlipidemia, unspecified: Secondary | ICD-10-CM | POA: Diagnosis not present

## 2016-04-19 DIAGNOSIS — G40909 Epilepsy, unspecified, not intractable, without status epilepticus: Secondary | ICD-10-CM | POA: Diagnosis not present

## 2016-05-07 DIAGNOSIS — G609 Hereditary and idiopathic neuropathy, unspecified: Secondary | ICD-10-CM | POA: Diagnosis not present

## 2016-05-07 DIAGNOSIS — L02611 Cutaneous abscess of right foot: Secondary | ICD-10-CM | POA: Diagnosis not present

## 2016-05-07 DIAGNOSIS — L603 Nail dystrophy: Secondary | ICD-10-CM | POA: Insufficient documentation

## 2016-05-08 DIAGNOSIS — L02611 Cutaneous abscess of right foot: Secondary | ICD-10-CM | POA: Diagnosis not present

## 2016-05-21 DIAGNOSIS — L02611 Cutaneous abscess of right foot: Secondary | ICD-10-CM | POA: Diagnosis not present

## 2016-05-21 DIAGNOSIS — G609 Hereditary and idiopathic neuropathy, unspecified: Secondary | ICD-10-CM | POA: Diagnosis not present

## 2016-05-25 DIAGNOSIS — L718 Other rosacea: Secondary | ICD-10-CM | POA: Diagnosis not present

## 2016-05-25 DIAGNOSIS — Z85828 Personal history of other malignant neoplasm of skin: Secondary | ICD-10-CM | POA: Diagnosis not present

## 2016-07-25 DIAGNOSIS — G609 Hereditary and idiopathic neuropathy, unspecified: Secondary | ICD-10-CM | POA: Diagnosis not present

## 2016-07-25 DIAGNOSIS — L97512 Non-pressure chronic ulcer of other part of right foot with fat layer exposed: Secondary | ICD-10-CM | POA: Diagnosis not present

## 2016-07-25 DIAGNOSIS — M2041 Other hammer toe(s) (acquired), right foot: Secondary | ICD-10-CM | POA: Diagnosis not present

## 2016-07-30 DIAGNOSIS — I10 Essential (primary) hypertension: Secondary | ICD-10-CM | POA: Diagnosis not present

## 2016-07-30 DIAGNOSIS — W19XXXD Unspecified fall, subsequent encounter: Secondary | ICD-10-CM | POA: Diagnosis not present

## 2016-07-30 DIAGNOSIS — Z79899 Other long term (current) drug therapy: Secondary | ICD-10-CM | POA: Diagnosis not present

## 2016-07-30 DIAGNOSIS — S72142D Displaced intertrochanteric fracture of left femur, subsequent encounter for closed fracture with routine healing: Secondary | ICD-10-CM | POA: Diagnosis not present

## 2016-07-30 DIAGNOSIS — E785 Hyperlipidemia, unspecified: Secondary | ICD-10-CM | POA: Diagnosis not present

## 2016-07-30 DIAGNOSIS — M85852 Other specified disorders of bone density and structure, left thigh: Secondary | ICD-10-CM | POA: Diagnosis not present

## 2016-07-30 DIAGNOSIS — S299XXA Unspecified injury of thorax, initial encounter: Secondary | ICD-10-CM | POA: Diagnosis not present

## 2016-07-30 DIAGNOSIS — M25552 Pain in left hip: Secondary | ICD-10-CM | POA: Diagnosis not present

## 2016-07-30 DIAGNOSIS — S72009A Fracture of unspecified part of neck of unspecified femur, initial encounter for closed fracture: Secondary | ICD-10-CM | POA: Diagnosis not present

## 2016-07-30 DIAGNOSIS — T148XXA Other injury of unspecified body region, initial encounter: Secondary | ICD-10-CM | POA: Diagnosis not present

## 2016-07-30 DIAGNOSIS — S72142A Displaced intertrochanteric fracture of left femur, initial encounter for closed fracture: Secondary | ICD-10-CM | POA: Diagnosis not present

## 2016-07-30 DIAGNOSIS — R269 Unspecified abnormalities of gait and mobility: Secondary | ICD-10-CM | POA: Diagnosis not present

## 2016-07-30 DIAGNOSIS — Z85841 Personal history of malignant neoplasm of brain: Secondary | ICD-10-CM | POA: Diagnosis not present

## 2016-07-30 DIAGNOSIS — S79911A Unspecified injury of right hip, initial encounter: Secondary | ICD-10-CM | POA: Diagnosis not present

## 2016-07-30 DIAGNOSIS — S72002A Fracture of unspecified part of neck of left femur, initial encounter for closed fracture: Secondary | ICD-10-CM | POA: Diagnosis not present

## 2016-07-30 DIAGNOSIS — N400 Enlarged prostate without lower urinary tract symptoms: Secondary | ICD-10-CM | POA: Diagnosis not present

## 2016-07-30 DIAGNOSIS — S728X9A Other fracture of unspecified femur, initial encounter for closed fracture: Secondary | ICD-10-CM | POA: Diagnosis not present

## 2016-07-30 DIAGNOSIS — S72141A Displaced intertrochanteric fracture of right femur, initial encounter for closed fracture: Secondary | ICD-10-CM | POA: Diagnosis not present

## 2016-08-01 DIAGNOSIS — S72142A Displaced intertrochanteric fracture of left femur, initial encounter for closed fracture: Secondary | ICD-10-CM

## 2016-08-01 DIAGNOSIS — N400 Enlarged prostate without lower urinary tract symptoms: Secondary | ICD-10-CM

## 2016-08-01 DIAGNOSIS — E785 Hyperlipidemia, unspecified: Secondary | ICD-10-CM

## 2016-08-02 DIAGNOSIS — S728X9A Other fracture of unspecified femur, initial encounter for closed fracture: Secondary | ICD-10-CM | POA: Diagnosis not present

## 2016-08-02 DIAGNOSIS — D649 Anemia, unspecified: Secondary | ICD-10-CM | POA: Diagnosis not present

## 2016-08-02 DIAGNOSIS — S72142A Displaced intertrochanteric fracture of left femur, initial encounter for closed fracture: Secondary | ICD-10-CM | POA: Diagnosis not present

## 2016-08-02 DIAGNOSIS — Z85841 Personal history of malignant neoplasm of brain: Secondary | ICD-10-CM | POA: Diagnosis not present

## 2016-08-02 DIAGNOSIS — I10 Essential (primary) hypertension: Secondary | ICD-10-CM | POA: Diagnosis not present

## 2016-08-02 DIAGNOSIS — R262 Difficulty in walking, not elsewhere classified: Secondary | ICD-10-CM | POA: Diagnosis not present

## 2016-08-02 DIAGNOSIS — N400 Enlarged prostate without lower urinary tract symptoms: Secondary | ICD-10-CM | POA: Diagnosis not present

## 2016-08-02 DIAGNOSIS — S72142D Displaced intertrochanteric fracture of left femur, subsequent encounter for closed fracture with routine healing: Secondary | ICD-10-CM | POA: Diagnosis not present

## 2016-08-02 DIAGNOSIS — G8918 Other acute postprocedural pain: Secondary | ICD-10-CM | POA: Diagnosis not present

## 2016-08-02 DIAGNOSIS — E785 Hyperlipidemia, unspecified: Secondary | ICD-10-CM | POA: Diagnosis not present

## 2016-08-02 DIAGNOSIS — W19XXXD Unspecified fall, subsequent encounter: Secondary | ICD-10-CM | POA: Diagnosis not present

## 2016-08-02 DIAGNOSIS — S79911A Unspecified injury of right hip, initial encounter: Secondary | ICD-10-CM | POA: Diagnosis not present

## 2016-08-02 DIAGNOSIS — R6889 Other general symptoms and signs: Secondary | ICD-10-CM | POA: Diagnosis not present

## 2016-08-03 DIAGNOSIS — R262 Difficulty in walking, not elsewhere classified: Secondary | ICD-10-CM | POA: Diagnosis not present

## 2016-08-03 DIAGNOSIS — S72142D Displaced intertrochanteric fracture of left femur, subsequent encounter for closed fracture with routine healing: Secondary | ICD-10-CM | POA: Diagnosis not present

## 2016-08-03 DIAGNOSIS — D649 Anemia, unspecified: Secondary | ICD-10-CM | POA: Diagnosis not present

## 2016-08-03 DIAGNOSIS — G8918 Other acute postprocedural pain: Secondary | ICD-10-CM | POA: Diagnosis not present

## 2016-08-14 DIAGNOSIS — S72142A Displaced intertrochanteric fracture of left femur, initial encounter for closed fracture: Secondary | ICD-10-CM | POA: Diagnosis not present

## 2016-08-31 DIAGNOSIS — Z9181 History of falling: Secondary | ICD-10-CM | POA: Diagnosis not present

## 2016-08-31 DIAGNOSIS — W19XXXD Unspecified fall, subsequent encounter: Secondary | ICD-10-CM | POA: Diagnosis not present

## 2016-08-31 DIAGNOSIS — I509 Heart failure, unspecified: Secondary | ICD-10-CM | POA: Diagnosis not present

## 2016-08-31 DIAGNOSIS — M80052D Age-related osteoporosis with current pathological fracture, left femur, subsequent encounter for fracture with routine healing: Secondary | ICD-10-CM | POA: Diagnosis not present

## 2016-08-31 DIAGNOSIS — I110 Hypertensive heart disease with heart failure: Secondary | ICD-10-CM | POA: Diagnosis not present

## 2016-08-31 DIAGNOSIS — G4089 Other seizures: Secondary | ICD-10-CM | POA: Diagnosis not present

## 2016-08-31 DIAGNOSIS — Z85118 Personal history of other malignant neoplasm of bronchus and lung: Secondary | ICD-10-CM | POA: Diagnosis not present

## 2016-09-02 DIAGNOSIS — G4089 Other seizures: Secondary | ICD-10-CM | POA: Diagnosis not present

## 2016-09-02 DIAGNOSIS — M80052D Age-related osteoporosis with current pathological fracture, left femur, subsequent encounter for fracture with routine healing: Secondary | ICD-10-CM | POA: Diagnosis not present

## 2016-09-02 DIAGNOSIS — I110 Hypertensive heart disease with heart failure: Secondary | ICD-10-CM | POA: Diagnosis not present

## 2016-09-02 DIAGNOSIS — I509 Heart failure, unspecified: Secondary | ICD-10-CM | POA: Diagnosis not present

## 2016-09-02 DIAGNOSIS — W19XXXD Unspecified fall, subsequent encounter: Secondary | ICD-10-CM | POA: Diagnosis not present

## 2016-09-02 DIAGNOSIS — Z85118 Personal history of other malignant neoplasm of bronchus and lung: Secondary | ICD-10-CM | POA: Diagnosis not present

## 2016-09-03 DIAGNOSIS — G40909 Epilepsy, unspecified, not intractable, without status epilepticus: Secondary | ICD-10-CM | POA: Diagnosis not present

## 2016-09-03 DIAGNOSIS — I10 Essential (primary) hypertension: Secondary | ICD-10-CM | POA: Diagnosis not present

## 2016-09-03 DIAGNOSIS — H6123 Impacted cerumen, bilateral: Secondary | ICD-10-CM | POA: Diagnosis not present

## 2016-09-03 DIAGNOSIS — S72142A Displaced intertrochanteric fracture of left femur, initial encounter for closed fracture: Secondary | ICD-10-CM | POA: Diagnosis not present

## 2016-09-03 DIAGNOSIS — Z79899 Other long term (current) drug therapy: Secondary | ICD-10-CM | POA: Diagnosis not present

## 2016-09-03 DIAGNOSIS — Z6824 Body mass index (BMI) 24.0-24.9, adult: Secondary | ICD-10-CM | POA: Diagnosis not present

## 2016-09-03 DIAGNOSIS — M25552 Pain in left hip: Secondary | ICD-10-CM | POA: Diagnosis not present

## 2016-09-03 DIAGNOSIS — I509 Heart failure, unspecified: Secondary | ICD-10-CM | POA: Diagnosis not present

## 2016-09-04 DIAGNOSIS — E039 Hypothyroidism, unspecified: Secondary | ICD-10-CM | POA: Diagnosis not present

## 2016-09-04 DIAGNOSIS — M6281 Muscle weakness (generalized): Secondary | ICD-10-CM | POA: Diagnosis not present

## 2016-09-04 DIAGNOSIS — M80052D Age-related osteoporosis with current pathological fracture, left femur, subsequent encounter for fracture with routine healing: Secondary | ICD-10-CM | POA: Diagnosis not present

## 2016-09-04 DIAGNOSIS — W19XXXD Unspecified fall, subsequent encounter: Secondary | ICD-10-CM | POA: Diagnosis not present

## 2016-09-04 DIAGNOSIS — R269 Unspecified abnormalities of gait and mobility: Secondary | ICD-10-CM | POA: Diagnosis not present

## 2016-09-04 DIAGNOSIS — Z85118 Personal history of other malignant neoplasm of bronchus and lung: Secondary | ICD-10-CM | POA: Diagnosis not present

## 2016-09-04 DIAGNOSIS — I509 Heart failure, unspecified: Secondary | ICD-10-CM | POA: Diagnosis not present

## 2016-09-04 DIAGNOSIS — I10 Essential (primary) hypertension: Secondary | ICD-10-CM | POA: Diagnosis not present

## 2016-09-04 DIAGNOSIS — I110 Hypertensive heart disease with heart failure: Secondary | ICD-10-CM | POA: Diagnosis not present

## 2016-09-04 DIAGNOSIS — I2510 Atherosclerotic heart disease of native coronary artery without angina pectoris: Secondary | ICD-10-CM | POA: Diagnosis not present

## 2016-09-04 DIAGNOSIS — Z6825 Body mass index (BMI) 25.0-25.9, adult: Secondary | ICD-10-CM | POA: Diagnosis not present

## 2016-09-04 DIAGNOSIS — G4089 Other seizures: Secondary | ICD-10-CM | POA: Diagnosis not present

## 2016-09-05 DIAGNOSIS — G4089 Other seizures: Secondary | ICD-10-CM | POA: Diagnosis not present

## 2016-09-05 DIAGNOSIS — M810 Age-related osteoporosis without current pathological fracture: Secondary | ICD-10-CM | POA: Diagnosis not present

## 2016-09-05 DIAGNOSIS — N401 Enlarged prostate with lower urinary tract symptoms: Secondary | ICD-10-CM | POA: Diagnosis not present

## 2016-09-05 DIAGNOSIS — G40909 Epilepsy, unspecified, not intractable, without status epilepticus: Secondary | ICD-10-CM | POA: Diagnosis not present

## 2016-09-05 DIAGNOSIS — M80052D Age-related osteoporosis with current pathological fracture, left femur, subsequent encounter for fracture with routine healing: Secondary | ICD-10-CM | POA: Diagnosis not present

## 2016-09-05 DIAGNOSIS — I509 Heart failure, unspecified: Secondary | ICD-10-CM | POA: Diagnosis not present

## 2016-09-05 DIAGNOSIS — E785 Hyperlipidemia, unspecified: Secondary | ICD-10-CM | POA: Diagnosis not present

## 2016-09-05 DIAGNOSIS — Z85118 Personal history of other malignant neoplasm of bronchus and lung: Secondary | ICD-10-CM | POA: Diagnosis not present

## 2016-09-05 DIAGNOSIS — I10 Essential (primary) hypertension: Secondary | ICD-10-CM | POA: Diagnosis not present

## 2016-09-05 DIAGNOSIS — E559 Vitamin D deficiency, unspecified: Secondary | ICD-10-CM | POA: Diagnosis not present

## 2016-09-05 DIAGNOSIS — I110 Hypertensive heart disease with heart failure: Secondary | ICD-10-CM | POA: Diagnosis not present

## 2016-09-05 DIAGNOSIS — W19XXXD Unspecified fall, subsequent encounter: Secondary | ICD-10-CM | POA: Diagnosis not present

## 2016-09-07 DIAGNOSIS — G4089 Other seizures: Secondary | ICD-10-CM | POA: Diagnosis not present

## 2016-09-07 DIAGNOSIS — Z85118 Personal history of other malignant neoplasm of bronchus and lung: Secondary | ICD-10-CM | POA: Diagnosis not present

## 2016-09-07 DIAGNOSIS — W19XXXD Unspecified fall, subsequent encounter: Secondary | ICD-10-CM | POA: Diagnosis not present

## 2016-09-07 DIAGNOSIS — I509 Heart failure, unspecified: Secondary | ICD-10-CM | POA: Diagnosis not present

## 2016-09-07 DIAGNOSIS — I110 Hypertensive heart disease with heart failure: Secondary | ICD-10-CM | POA: Diagnosis not present

## 2016-09-07 DIAGNOSIS — M80052D Age-related osteoporosis with current pathological fracture, left femur, subsequent encounter for fracture with routine healing: Secondary | ICD-10-CM | POA: Diagnosis not present

## 2016-09-10 DIAGNOSIS — M80052D Age-related osteoporosis with current pathological fracture, left femur, subsequent encounter for fracture with routine healing: Secondary | ICD-10-CM | POA: Diagnosis not present

## 2016-09-10 DIAGNOSIS — I509 Heart failure, unspecified: Secondary | ICD-10-CM | POA: Diagnosis not present

## 2016-09-10 DIAGNOSIS — G4089 Other seizures: Secondary | ICD-10-CM | POA: Diagnosis not present

## 2016-09-10 DIAGNOSIS — W19XXXD Unspecified fall, subsequent encounter: Secondary | ICD-10-CM | POA: Diagnosis not present

## 2016-09-10 DIAGNOSIS — I110 Hypertensive heart disease with heart failure: Secondary | ICD-10-CM | POA: Diagnosis not present

## 2016-09-10 DIAGNOSIS — Z85118 Personal history of other malignant neoplasm of bronchus and lung: Secondary | ICD-10-CM | POA: Diagnosis not present

## 2016-09-11 DIAGNOSIS — I509 Heart failure, unspecified: Secondary | ICD-10-CM | POA: Diagnosis not present

## 2016-09-11 DIAGNOSIS — W19XXXD Unspecified fall, subsequent encounter: Secondary | ICD-10-CM | POA: Diagnosis not present

## 2016-09-11 DIAGNOSIS — G4089 Other seizures: Secondary | ICD-10-CM | POA: Diagnosis not present

## 2016-09-11 DIAGNOSIS — Z85118 Personal history of other malignant neoplasm of bronchus and lung: Secondary | ICD-10-CM | POA: Diagnosis not present

## 2016-09-11 DIAGNOSIS — I110 Hypertensive heart disease with heart failure: Secondary | ICD-10-CM | POA: Diagnosis not present

## 2016-09-11 DIAGNOSIS — M80052D Age-related osteoporosis with current pathological fracture, left femur, subsequent encounter for fracture with routine healing: Secondary | ICD-10-CM | POA: Diagnosis not present

## 2016-09-11 DIAGNOSIS — S72142A Displaced intertrochanteric fracture of left femur, initial encounter for closed fracture: Secondary | ICD-10-CM | POA: Diagnosis not present

## 2016-09-12 DIAGNOSIS — Z85118 Personal history of other malignant neoplasm of bronchus and lung: Secondary | ICD-10-CM | POA: Diagnosis not present

## 2016-09-12 DIAGNOSIS — M80052D Age-related osteoporosis with current pathological fracture, left femur, subsequent encounter for fracture with routine healing: Secondary | ICD-10-CM | POA: Diagnosis not present

## 2016-09-12 DIAGNOSIS — I509 Heart failure, unspecified: Secondary | ICD-10-CM | POA: Diagnosis not present

## 2016-09-12 DIAGNOSIS — W19XXXD Unspecified fall, subsequent encounter: Secondary | ICD-10-CM | POA: Diagnosis not present

## 2016-09-12 DIAGNOSIS — I10 Essential (primary) hypertension: Secondary | ICD-10-CM | POA: Diagnosis not present

## 2016-09-12 DIAGNOSIS — G4089 Other seizures: Secondary | ICD-10-CM | POA: Diagnosis not present

## 2016-09-12 DIAGNOSIS — I110 Hypertensive heart disease with heart failure: Secondary | ICD-10-CM | POA: Diagnosis not present

## 2016-09-12 DIAGNOSIS — R0789 Other chest pain: Secondary | ICD-10-CM | POA: Diagnosis not present

## 2016-09-12 DIAGNOSIS — R000 Tachycardia, unspecified: Secondary | ICD-10-CM | POA: Diagnosis not present

## 2016-09-13 DIAGNOSIS — I509 Heart failure, unspecified: Secondary | ICD-10-CM | POA: Diagnosis not present

## 2016-09-13 DIAGNOSIS — G4089 Other seizures: Secondary | ICD-10-CM | POA: Diagnosis not present

## 2016-09-13 DIAGNOSIS — I110 Hypertensive heart disease with heart failure: Secondary | ICD-10-CM | POA: Diagnosis not present

## 2016-09-13 DIAGNOSIS — Z85118 Personal history of other malignant neoplasm of bronchus and lung: Secondary | ICD-10-CM | POA: Diagnosis not present

## 2016-09-13 DIAGNOSIS — W19XXXD Unspecified fall, subsequent encounter: Secondary | ICD-10-CM | POA: Diagnosis not present

## 2016-09-13 DIAGNOSIS — M80052D Age-related osteoporosis with current pathological fracture, left femur, subsequent encounter for fracture with routine healing: Secondary | ICD-10-CM | POA: Diagnosis not present

## 2016-09-14 DIAGNOSIS — I110 Hypertensive heart disease with heart failure: Secondary | ICD-10-CM | POA: Diagnosis not present

## 2016-09-14 DIAGNOSIS — G4089 Other seizures: Secondary | ICD-10-CM | POA: Diagnosis not present

## 2016-09-14 DIAGNOSIS — I509 Heart failure, unspecified: Secondary | ICD-10-CM | POA: Diagnosis not present

## 2016-09-14 DIAGNOSIS — M80052D Age-related osteoporosis with current pathological fracture, left femur, subsequent encounter for fracture with routine healing: Secondary | ICD-10-CM | POA: Diagnosis not present

## 2016-09-14 DIAGNOSIS — Z85118 Personal history of other malignant neoplasm of bronchus and lung: Secondary | ICD-10-CM | POA: Diagnosis not present

## 2016-09-14 DIAGNOSIS — W19XXXD Unspecified fall, subsequent encounter: Secondary | ICD-10-CM | POA: Diagnosis not present

## 2016-09-17 DIAGNOSIS — Z85118 Personal history of other malignant neoplasm of bronchus and lung: Secondary | ICD-10-CM | POA: Diagnosis not present

## 2016-09-17 DIAGNOSIS — Z79899 Other long term (current) drug therapy: Secondary | ICD-10-CM | POA: Diagnosis not present

## 2016-09-17 DIAGNOSIS — I509 Heart failure, unspecified: Secondary | ICD-10-CM | POA: Diagnosis not present

## 2016-09-17 DIAGNOSIS — G4089 Other seizures: Secondary | ICD-10-CM | POA: Diagnosis not present

## 2016-09-17 DIAGNOSIS — I110 Hypertensive heart disease with heart failure: Secondary | ICD-10-CM | POA: Diagnosis not present

## 2016-09-17 DIAGNOSIS — M80052D Age-related osteoporosis with current pathological fracture, left femur, subsequent encounter for fracture with routine healing: Secondary | ICD-10-CM | POA: Diagnosis not present

## 2016-09-17 DIAGNOSIS — W19XXXD Unspecified fall, subsequent encounter: Secondary | ICD-10-CM | POA: Diagnosis not present

## 2016-09-18 DIAGNOSIS — I509 Heart failure, unspecified: Secondary | ICD-10-CM | POA: Diagnosis not present

## 2016-09-18 DIAGNOSIS — M80052D Age-related osteoporosis with current pathological fracture, left femur, subsequent encounter for fracture with routine healing: Secondary | ICD-10-CM | POA: Diagnosis not present

## 2016-09-18 DIAGNOSIS — Z85118 Personal history of other malignant neoplasm of bronchus and lung: Secondary | ICD-10-CM | POA: Diagnosis not present

## 2016-09-18 DIAGNOSIS — W19XXXD Unspecified fall, subsequent encounter: Secondary | ICD-10-CM | POA: Diagnosis not present

## 2016-09-18 DIAGNOSIS — G4089 Other seizures: Secondary | ICD-10-CM | POA: Diagnosis not present

## 2016-09-18 DIAGNOSIS — I110 Hypertensive heart disease with heart failure: Secondary | ICD-10-CM | POA: Diagnosis not present

## 2016-09-19 DIAGNOSIS — G40909 Epilepsy, unspecified, not intractable, without status epilepticus: Secondary | ICD-10-CM | POA: Diagnosis not present

## 2016-09-19 DIAGNOSIS — Z5181 Encounter for therapeutic drug level monitoring: Secondary | ICD-10-CM | POA: Diagnosis not present

## 2016-09-20 DIAGNOSIS — R54 Age-related physical debility: Secondary | ICD-10-CM | POA: Diagnosis not present

## 2016-09-20 DIAGNOSIS — I110 Hypertensive heart disease with heart failure: Secondary | ICD-10-CM | POA: Diagnosis not present

## 2016-09-20 DIAGNOSIS — M6281 Muscle weakness (generalized): Secondary | ICD-10-CM | POA: Diagnosis not present

## 2016-09-20 DIAGNOSIS — G4089 Other seizures: Secondary | ICD-10-CM | POA: Diagnosis not present

## 2016-09-20 DIAGNOSIS — R269 Unspecified abnormalities of gait and mobility: Secondary | ICD-10-CM | POA: Diagnosis not present

## 2016-09-20 DIAGNOSIS — M80052D Age-related osteoporosis with current pathological fracture, left femur, subsequent encounter for fracture with routine healing: Secondary | ICD-10-CM | POA: Diagnosis not present

## 2016-09-20 DIAGNOSIS — W19XXXD Unspecified fall, subsequent encounter: Secondary | ICD-10-CM | POA: Diagnosis not present

## 2016-09-20 DIAGNOSIS — I509 Heart failure, unspecified: Secondary | ICD-10-CM | POA: Diagnosis not present

## 2016-09-20 DIAGNOSIS — R569 Unspecified convulsions: Secondary | ICD-10-CM | POA: Diagnosis not present

## 2016-09-20 DIAGNOSIS — Z85118 Personal history of other malignant neoplasm of bronchus and lung: Secondary | ICD-10-CM | POA: Diagnosis not present

## 2016-09-21 DIAGNOSIS — Z85118 Personal history of other malignant neoplasm of bronchus and lung: Secondary | ICD-10-CM | POA: Diagnosis not present

## 2016-09-21 DIAGNOSIS — M80052D Age-related osteoporosis with current pathological fracture, left femur, subsequent encounter for fracture with routine healing: Secondary | ICD-10-CM | POA: Diagnosis not present

## 2016-09-21 DIAGNOSIS — G4089 Other seizures: Secondary | ICD-10-CM | POA: Diagnosis not present

## 2016-09-21 DIAGNOSIS — I509 Heart failure, unspecified: Secondary | ICD-10-CM | POA: Diagnosis not present

## 2016-09-21 DIAGNOSIS — I110 Hypertensive heart disease with heart failure: Secondary | ICD-10-CM | POA: Diagnosis not present

## 2016-09-21 DIAGNOSIS — W19XXXD Unspecified fall, subsequent encounter: Secondary | ICD-10-CM | POA: Diagnosis not present

## 2016-09-25 DIAGNOSIS — G4089 Other seizures: Secondary | ICD-10-CM | POA: Diagnosis not present

## 2016-09-25 DIAGNOSIS — M80052D Age-related osteoporosis with current pathological fracture, left femur, subsequent encounter for fracture with routine healing: Secondary | ICD-10-CM | POA: Diagnosis not present

## 2016-09-25 DIAGNOSIS — I509 Heart failure, unspecified: Secondary | ICD-10-CM | POA: Diagnosis not present

## 2016-09-25 DIAGNOSIS — I110 Hypertensive heart disease with heart failure: Secondary | ICD-10-CM | POA: Diagnosis not present

## 2016-09-25 DIAGNOSIS — Z85118 Personal history of other malignant neoplasm of bronchus and lung: Secondary | ICD-10-CM | POA: Diagnosis not present

## 2016-09-25 DIAGNOSIS — W19XXXD Unspecified fall, subsequent encounter: Secondary | ICD-10-CM | POA: Diagnosis not present

## 2016-09-26 DIAGNOSIS — W19XXXD Unspecified fall, subsequent encounter: Secondary | ICD-10-CM | POA: Diagnosis not present

## 2016-09-26 DIAGNOSIS — M80052D Age-related osteoporosis with current pathological fracture, left femur, subsequent encounter for fracture with routine healing: Secondary | ICD-10-CM | POA: Diagnosis not present

## 2016-09-26 DIAGNOSIS — G4089 Other seizures: Secondary | ICD-10-CM | POA: Diagnosis not present

## 2016-09-26 DIAGNOSIS — I110 Hypertensive heart disease with heart failure: Secondary | ICD-10-CM | POA: Diagnosis not present

## 2016-09-26 DIAGNOSIS — Z85118 Personal history of other malignant neoplasm of bronchus and lung: Secondary | ICD-10-CM | POA: Diagnosis not present

## 2016-09-26 DIAGNOSIS — I509 Heart failure, unspecified: Secondary | ICD-10-CM | POA: Diagnosis not present

## 2016-09-27 DIAGNOSIS — G4089 Other seizures: Secondary | ICD-10-CM | POA: Diagnosis not present

## 2016-09-28 DIAGNOSIS — Z85118 Personal history of other malignant neoplasm of bronchus and lung: Secondary | ICD-10-CM | POA: Diagnosis not present

## 2016-09-28 DIAGNOSIS — W19XXXD Unspecified fall, subsequent encounter: Secondary | ICD-10-CM | POA: Diagnosis not present

## 2016-09-28 DIAGNOSIS — I110 Hypertensive heart disease with heart failure: Secondary | ICD-10-CM | POA: Diagnosis not present

## 2016-09-28 DIAGNOSIS — G4089 Other seizures: Secondary | ICD-10-CM | POA: Diagnosis not present

## 2016-09-28 DIAGNOSIS — I509 Heart failure, unspecified: Secondary | ICD-10-CM | POA: Diagnosis not present

## 2016-09-28 DIAGNOSIS — M80052D Age-related osteoporosis with current pathological fracture, left femur, subsequent encounter for fracture with routine healing: Secondary | ICD-10-CM | POA: Diagnosis not present

## 2016-10-01 DIAGNOSIS — W19XXXD Unspecified fall, subsequent encounter: Secondary | ICD-10-CM | POA: Diagnosis not present

## 2016-10-01 DIAGNOSIS — I110 Hypertensive heart disease with heart failure: Secondary | ICD-10-CM | POA: Diagnosis not present

## 2016-10-01 DIAGNOSIS — G4089 Other seizures: Secondary | ICD-10-CM | POA: Diagnosis not present

## 2016-10-01 DIAGNOSIS — Z85118 Personal history of other malignant neoplasm of bronchus and lung: Secondary | ICD-10-CM | POA: Diagnosis not present

## 2016-10-01 DIAGNOSIS — M80052D Age-related osteoporosis with current pathological fracture, left femur, subsequent encounter for fracture with routine healing: Secondary | ICD-10-CM | POA: Diagnosis not present

## 2016-10-01 DIAGNOSIS — I509 Heart failure, unspecified: Secondary | ICD-10-CM | POA: Diagnosis not present

## 2016-10-04 DIAGNOSIS — W19XXXD Unspecified fall, subsequent encounter: Secondary | ICD-10-CM | POA: Diagnosis not present

## 2016-10-04 DIAGNOSIS — I509 Heart failure, unspecified: Secondary | ICD-10-CM | POA: Diagnosis not present

## 2016-10-04 DIAGNOSIS — G4089 Other seizures: Secondary | ICD-10-CM | POA: Diagnosis not present

## 2016-10-04 DIAGNOSIS — Z85118 Personal history of other malignant neoplasm of bronchus and lung: Secondary | ICD-10-CM | POA: Diagnosis not present

## 2016-10-04 DIAGNOSIS — M80052D Age-related osteoporosis with current pathological fracture, left femur, subsequent encounter for fracture with routine healing: Secondary | ICD-10-CM | POA: Diagnosis not present

## 2016-10-04 DIAGNOSIS — I110 Hypertensive heart disease with heart failure: Secondary | ICD-10-CM | POA: Diagnosis not present

## 2016-10-05 DIAGNOSIS — I509 Heart failure, unspecified: Secondary | ICD-10-CM | POA: Diagnosis not present

## 2016-10-05 DIAGNOSIS — Z85118 Personal history of other malignant neoplasm of bronchus and lung: Secondary | ICD-10-CM | POA: Diagnosis not present

## 2016-10-05 DIAGNOSIS — I110 Hypertensive heart disease with heart failure: Secondary | ICD-10-CM | POA: Diagnosis not present

## 2016-10-05 DIAGNOSIS — G4089 Other seizures: Secondary | ICD-10-CM | POA: Diagnosis not present

## 2016-10-05 DIAGNOSIS — M80052D Age-related osteoporosis with current pathological fracture, left femur, subsequent encounter for fracture with routine healing: Secondary | ICD-10-CM | POA: Diagnosis not present

## 2016-10-05 DIAGNOSIS — W19XXXD Unspecified fall, subsequent encounter: Secondary | ICD-10-CM | POA: Diagnosis not present

## 2016-10-08 DIAGNOSIS — G4089 Other seizures: Secondary | ICD-10-CM | POA: Diagnosis not present

## 2016-10-08 DIAGNOSIS — I509 Heart failure, unspecified: Secondary | ICD-10-CM | POA: Diagnosis not present

## 2016-10-08 DIAGNOSIS — W19XXXD Unspecified fall, subsequent encounter: Secondary | ICD-10-CM | POA: Diagnosis not present

## 2016-10-08 DIAGNOSIS — M80052D Age-related osteoporosis with current pathological fracture, left femur, subsequent encounter for fracture with routine healing: Secondary | ICD-10-CM | POA: Diagnosis not present

## 2016-10-08 DIAGNOSIS — Z85118 Personal history of other malignant neoplasm of bronchus and lung: Secondary | ICD-10-CM | POA: Diagnosis not present

## 2016-10-08 DIAGNOSIS — I110 Hypertensive heart disease with heart failure: Secondary | ICD-10-CM | POA: Diagnosis not present

## 2016-10-09 DIAGNOSIS — R9431 Abnormal electrocardiogram [ECG] [EKG]: Secondary | ICD-10-CM | POA: Insufficient documentation

## 2016-10-09 DIAGNOSIS — E785 Hyperlipidemia, unspecified: Secondary | ICD-10-CM | POA: Insufficient documentation

## 2016-10-09 DIAGNOSIS — I10 Essential (primary) hypertension: Secondary | ICD-10-CM | POA: Diagnosis not present

## 2016-10-10 DIAGNOSIS — Z85118 Personal history of other malignant neoplasm of bronchus and lung: Secondary | ICD-10-CM | POA: Diagnosis not present

## 2016-10-10 DIAGNOSIS — I509 Heart failure, unspecified: Secondary | ICD-10-CM | POA: Diagnosis not present

## 2016-10-10 DIAGNOSIS — W19XXXD Unspecified fall, subsequent encounter: Secondary | ICD-10-CM | POA: Diagnosis not present

## 2016-10-10 DIAGNOSIS — I110 Hypertensive heart disease with heart failure: Secondary | ICD-10-CM | POA: Diagnosis not present

## 2016-10-10 DIAGNOSIS — G4089 Other seizures: Secondary | ICD-10-CM | POA: Diagnosis not present

## 2016-10-10 DIAGNOSIS — M80052D Age-related osteoporosis with current pathological fracture, left femur, subsequent encounter for fracture with routine healing: Secondary | ICD-10-CM | POA: Diagnosis not present

## 2016-10-12 DIAGNOSIS — G4089 Other seizures: Secondary | ICD-10-CM | POA: Diagnosis not present

## 2016-10-12 DIAGNOSIS — W19XXXD Unspecified fall, subsequent encounter: Secondary | ICD-10-CM | POA: Diagnosis not present

## 2016-10-12 DIAGNOSIS — I509 Heart failure, unspecified: Secondary | ICD-10-CM | POA: Diagnosis not present

## 2016-10-12 DIAGNOSIS — M80052D Age-related osteoporosis with current pathological fracture, left femur, subsequent encounter for fracture with routine healing: Secondary | ICD-10-CM | POA: Diagnosis not present

## 2016-10-12 DIAGNOSIS — I110 Hypertensive heart disease with heart failure: Secondary | ICD-10-CM | POA: Diagnosis not present

## 2016-10-12 DIAGNOSIS — Z85118 Personal history of other malignant neoplasm of bronchus and lung: Secondary | ICD-10-CM | POA: Diagnosis not present

## 2016-10-15 DIAGNOSIS — I509 Heart failure, unspecified: Secondary | ICD-10-CM | POA: Diagnosis not present

## 2016-10-15 DIAGNOSIS — G4089 Other seizures: Secondary | ICD-10-CM | POA: Diagnosis not present

## 2016-10-15 DIAGNOSIS — W19XXXD Unspecified fall, subsequent encounter: Secondary | ICD-10-CM | POA: Diagnosis not present

## 2016-10-15 DIAGNOSIS — M80052D Age-related osteoporosis with current pathological fracture, left femur, subsequent encounter for fracture with routine healing: Secondary | ICD-10-CM | POA: Diagnosis not present

## 2016-10-15 DIAGNOSIS — I110 Hypertensive heart disease with heart failure: Secondary | ICD-10-CM | POA: Diagnosis not present

## 2016-10-15 DIAGNOSIS — Z85118 Personal history of other malignant neoplasm of bronchus and lung: Secondary | ICD-10-CM | POA: Diagnosis not present

## 2016-10-16 DIAGNOSIS — E785 Hyperlipidemia, unspecified: Secondary | ICD-10-CM | POA: Diagnosis not present

## 2016-10-16 DIAGNOSIS — R079 Chest pain, unspecified: Secondary | ICD-10-CM | POA: Diagnosis not present

## 2016-10-16 DIAGNOSIS — R9431 Abnormal electrocardiogram [ECG] [EKG]: Secondary | ICD-10-CM | POA: Diagnosis not present

## 2016-10-16 DIAGNOSIS — I10 Essential (primary) hypertension: Secondary | ICD-10-CM | POA: Diagnosis not present

## 2016-10-16 DIAGNOSIS — R0602 Shortness of breath: Secondary | ICD-10-CM | POA: Diagnosis not present

## 2016-10-17 DIAGNOSIS — I110 Hypertensive heart disease with heart failure: Secondary | ICD-10-CM | POA: Diagnosis not present

## 2016-10-17 DIAGNOSIS — I509 Heart failure, unspecified: Secondary | ICD-10-CM | POA: Diagnosis not present

## 2016-10-17 DIAGNOSIS — G4089 Other seizures: Secondary | ICD-10-CM | POA: Diagnosis not present

## 2016-10-17 DIAGNOSIS — M80052D Age-related osteoporosis with current pathological fracture, left femur, subsequent encounter for fracture with routine healing: Secondary | ICD-10-CM | POA: Diagnosis not present

## 2016-10-17 DIAGNOSIS — W19XXXD Unspecified fall, subsequent encounter: Secondary | ICD-10-CM | POA: Diagnosis not present

## 2016-10-17 DIAGNOSIS — Z85118 Personal history of other malignant neoplasm of bronchus and lung: Secondary | ICD-10-CM | POA: Diagnosis not present

## 2016-10-19 DIAGNOSIS — Z85118 Personal history of other malignant neoplasm of bronchus and lung: Secondary | ICD-10-CM | POA: Diagnosis not present

## 2016-10-19 DIAGNOSIS — I110 Hypertensive heart disease with heart failure: Secondary | ICD-10-CM | POA: Diagnosis not present

## 2016-10-19 DIAGNOSIS — W19XXXD Unspecified fall, subsequent encounter: Secondary | ICD-10-CM | POA: Diagnosis not present

## 2016-10-19 DIAGNOSIS — G4089 Other seizures: Secondary | ICD-10-CM | POA: Diagnosis not present

## 2016-10-19 DIAGNOSIS — I509 Heart failure, unspecified: Secondary | ICD-10-CM | POA: Diagnosis not present

## 2016-10-19 DIAGNOSIS — M80052D Age-related osteoporosis with current pathological fracture, left femur, subsequent encounter for fracture with routine healing: Secondary | ICD-10-CM | POA: Diagnosis not present

## 2016-10-22 DIAGNOSIS — S72142A Displaced intertrochanteric fracture of left femur, initial encounter for closed fracture: Secondary | ICD-10-CM | POA: Diagnosis not present

## 2016-10-23 DIAGNOSIS — E785 Hyperlipidemia, unspecified: Secondary | ICD-10-CM | POA: Diagnosis not present

## 2016-10-23 DIAGNOSIS — E559 Vitamin D deficiency, unspecified: Secondary | ICD-10-CM | POA: Diagnosis not present

## 2016-10-23 DIAGNOSIS — Z23 Encounter for immunization: Secondary | ICD-10-CM | POA: Diagnosis not present

## 2016-10-23 DIAGNOSIS — C3491 Malignant neoplasm of unspecified part of right bronchus or lung: Secondary | ICD-10-CM | POA: Diagnosis not present

## 2016-10-23 DIAGNOSIS — Z79899 Other long term (current) drug therapy: Secondary | ICD-10-CM | POA: Diagnosis not present

## 2016-10-23 DIAGNOSIS — Z6824 Body mass index (BMI) 24.0-24.9, adult: Secondary | ICD-10-CM | POA: Diagnosis not present

## 2016-10-23 DIAGNOSIS — I10 Essential (primary) hypertension: Secondary | ICD-10-CM | POA: Diagnosis not present

## 2016-10-23 DIAGNOSIS — G40909 Epilepsy, unspecified, not intractable, without status epilepticus: Secondary | ICD-10-CM | POA: Diagnosis not present

## 2016-10-23 DIAGNOSIS — R000 Tachycardia, unspecified: Secondary | ICD-10-CM | POA: Diagnosis not present

## 2016-10-23 DIAGNOSIS — M810 Age-related osteoporosis without current pathological fracture: Secondary | ICD-10-CM | POA: Diagnosis not present

## 2016-10-26 DIAGNOSIS — M25552 Pain in left hip: Secondary | ICD-10-CM | POA: Diagnosis not present

## 2016-10-26 DIAGNOSIS — M25652 Stiffness of left hip, not elsewhere classified: Secondary | ICD-10-CM | POA: Diagnosis not present

## 2016-10-26 DIAGNOSIS — R2689 Other abnormalities of gait and mobility: Secondary | ICD-10-CM | POA: Diagnosis not present

## 2016-10-29 DIAGNOSIS — I700 Atherosclerosis of aorta: Secondary | ICD-10-CM | POA: Diagnosis not present

## 2016-10-29 DIAGNOSIS — C3491 Malignant neoplasm of unspecified part of right bronchus or lung: Secondary | ICD-10-CM | POA: Diagnosis not present

## 2016-10-29 DIAGNOSIS — C3411 Malignant neoplasm of upper lobe, right bronchus or lung: Secondary | ICD-10-CM | POA: Diagnosis not present

## 2016-10-29 DIAGNOSIS — J449 Chronic obstructive pulmonary disease, unspecified: Secondary | ICD-10-CM | POA: Diagnosis not present

## 2016-10-29 DIAGNOSIS — I77810 Thoracic aortic ectasia: Secondary | ICD-10-CM | POA: Diagnosis not present

## 2016-10-29 DIAGNOSIS — I2510 Atherosclerotic heart disease of native coronary artery without angina pectoris: Secondary | ICD-10-CM | POA: Diagnosis not present

## 2016-11-08 DIAGNOSIS — G40909 Epilepsy, unspecified, not intractable, without status epilepticus: Secondary | ICD-10-CM | POA: Diagnosis not present

## 2016-11-09 DIAGNOSIS — R2689 Other abnormalities of gait and mobility: Secondary | ICD-10-CM | POA: Diagnosis not present

## 2016-11-09 DIAGNOSIS — M25652 Stiffness of left hip, not elsewhere classified: Secondary | ICD-10-CM | POA: Diagnosis not present

## 2016-11-09 DIAGNOSIS — M25552 Pain in left hip: Secondary | ICD-10-CM | POA: Diagnosis not present

## 2016-11-12 DIAGNOSIS — M25552 Pain in left hip: Secondary | ICD-10-CM | POA: Diagnosis not present

## 2016-11-12 DIAGNOSIS — M25652 Stiffness of left hip, not elsewhere classified: Secondary | ICD-10-CM | POA: Diagnosis not present

## 2016-11-12 DIAGNOSIS — R2689 Other abnormalities of gait and mobility: Secondary | ICD-10-CM | POA: Diagnosis not present

## 2016-11-15 DIAGNOSIS — R2689 Other abnormalities of gait and mobility: Secondary | ICD-10-CM | POA: Diagnosis not present

## 2016-11-15 DIAGNOSIS — M25652 Stiffness of left hip, not elsewhere classified: Secondary | ICD-10-CM | POA: Diagnosis not present

## 2016-11-15 DIAGNOSIS — M25552 Pain in left hip: Secondary | ICD-10-CM | POA: Diagnosis not present

## 2016-11-20 DIAGNOSIS — R2689 Other abnormalities of gait and mobility: Secondary | ICD-10-CM | POA: Diagnosis not present

## 2016-11-20 DIAGNOSIS — M25652 Stiffness of left hip, not elsewhere classified: Secondary | ICD-10-CM | POA: Diagnosis not present

## 2016-11-20 DIAGNOSIS — M25552 Pain in left hip: Secondary | ICD-10-CM | POA: Diagnosis not present

## 2016-11-23 DIAGNOSIS — R2689 Other abnormalities of gait and mobility: Secondary | ICD-10-CM | POA: Diagnosis not present

## 2016-11-23 DIAGNOSIS — M25652 Stiffness of left hip, not elsewhere classified: Secondary | ICD-10-CM | POA: Diagnosis not present

## 2016-11-23 DIAGNOSIS — M25552 Pain in left hip: Secondary | ICD-10-CM | POA: Diagnosis not present

## 2016-11-27 DIAGNOSIS — M25552 Pain in left hip: Secondary | ICD-10-CM | POA: Diagnosis not present

## 2016-11-27 DIAGNOSIS — R2689 Other abnormalities of gait and mobility: Secondary | ICD-10-CM | POA: Diagnosis not present

## 2016-11-27 DIAGNOSIS — M25652 Stiffness of left hip, not elsewhere classified: Secondary | ICD-10-CM | POA: Diagnosis not present

## 2016-11-29 DIAGNOSIS — M25552 Pain in left hip: Secondary | ICD-10-CM | POA: Diagnosis not present

## 2016-11-29 DIAGNOSIS — M25652 Stiffness of left hip, not elsewhere classified: Secondary | ICD-10-CM | POA: Diagnosis not present

## 2016-11-29 DIAGNOSIS — R2689 Other abnormalities of gait and mobility: Secondary | ICD-10-CM | POA: Diagnosis not present

## 2016-12-03 DIAGNOSIS — M25652 Stiffness of left hip, not elsewhere classified: Secondary | ICD-10-CM | POA: Diagnosis not present

## 2016-12-03 DIAGNOSIS — M25552 Pain in left hip: Secondary | ICD-10-CM | POA: Diagnosis not present

## 2016-12-03 DIAGNOSIS — R2689 Other abnormalities of gait and mobility: Secondary | ICD-10-CM | POA: Diagnosis not present

## 2016-12-12 DIAGNOSIS — R2689 Other abnormalities of gait and mobility: Secondary | ICD-10-CM | POA: Diagnosis not present

## 2016-12-12 DIAGNOSIS — M25652 Stiffness of left hip, not elsewhere classified: Secondary | ICD-10-CM | POA: Diagnosis not present

## 2016-12-12 DIAGNOSIS — M25552 Pain in left hip: Secondary | ICD-10-CM | POA: Diagnosis not present

## 2016-12-14 DIAGNOSIS — M25552 Pain in left hip: Secondary | ICD-10-CM | POA: Diagnosis not present

## 2016-12-14 DIAGNOSIS — R2689 Other abnormalities of gait and mobility: Secondary | ICD-10-CM | POA: Diagnosis not present

## 2016-12-14 DIAGNOSIS — M25652 Stiffness of left hip, not elsewhere classified: Secondary | ICD-10-CM | POA: Diagnosis not present

## 2016-12-18 DIAGNOSIS — M25552 Pain in left hip: Secondary | ICD-10-CM | POA: Diagnosis not present

## 2016-12-18 DIAGNOSIS — M25652 Stiffness of left hip, not elsewhere classified: Secondary | ICD-10-CM | POA: Diagnosis not present

## 2016-12-18 DIAGNOSIS — R2689 Other abnormalities of gait and mobility: Secondary | ICD-10-CM | POA: Diagnosis not present

## 2016-12-21 DIAGNOSIS — R2689 Other abnormalities of gait and mobility: Secondary | ICD-10-CM | POA: Diagnosis not present

## 2016-12-21 DIAGNOSIS — M25652 Stiffness of left hip, not elsewhere classified: Secondary | ICD-10-CM | POA: Diagnosis not present

## 2016-12-21 DIAGNOSIS — M25552 Pain in left hip: Secondary | ICD-10-CM | POA: Diagnosis not present

## 2016-12-24 DIAGNOSIS — M25652 Stiffness of left hip, not elsewhere classified: Secondary | ICD-10-CM | POA: Diagnosis not present

## 2016-12-24 DIAGNOSIS — R2689 Other abnormalities of gait and mobility: Secondary | ICD-10-CM | POA: Diagnosis not present

## 2016-12-24 DIAGNOSIS — M25552 Pain in left hip: Secondary | ICD-10-CM | POA: Diagnosis not present

## 2016-12-31 DIAGNOSIS — R2689 Other abnormalities of gait and mobility: Secondary | ICD-10-CM | POA: Diagnosis not present

## 2016-12-31 DIAGNOSIS — M25552 Pain in left hip: Secondary | ICD-10-CM | POA: Diagnosis not present

## 2016-12-31 DIAGNOSIS — M25652 Stiffness of left hip, not elsewhere classified: Secondary | ICD-10-CM | POA: Diagnosis not present

## 2017-01-02 DIAGNOSIS — G609 Hereditary and idiopathic neuropathy, unspecified: Secondary | ICD-10-CM | POA: Diagnosis not present

## 2017-01-02 DIAGNOSIS — M2041 Other hammer toe(s) (acquired), right foot: Secondary | ICD-10-CM | POA: Diagnosis not present

## 2017-01-02 DIAGNOSIS — L97512 Non-pressure chronic ulcer of other part of right foot with fat layer exposed: Secondary | ICD-10-CM | POA: Diagnosis not present

## 2017-01-02 DIAGNOSIS — L03031 Cellulitis of right toe: Secondary | ICD-10-CM | POA: Diagnosis not present

## 2017-01-07 DIAGNOSIS — M25552 Pain in left hip: Secondary | ICD-10-CM | POA: Diagnosis not present

## 2017-01-07 DIAGNOSIS — R2689 Other abnormalities of gait and mobility: Secondary | ICD-10-CM | POA: Diagnosis not present

## 2017-01-07 DIAGNOSIS — M25652 Stiffness of left hip, not elsewhere classified: Secondary | ICD-10-CM | POA: Diagnosis not present

## 2017-01-09 DIAGNOSIS — M25552 Pain in left hip: Secondary | ICD-10-CM | POA: Diagnosis not present

## 2017-01-09 DIAGNOSIS — M25652 Stiffness of left hip, not elsewhere classified: Secondary | ICD-10-CM | POA: Diagnosis not present

## 2017-01-09 DIAGNOSIS — R2689 Other abnormalities of gait and mobility: Secondary | ICD-10-CM | POA: Diagnosis not present

## 2017-01-16 DIAGNOSIS — L97512 Non-pressure chronic ulcer of other part of right foot with fat layer exposed: Secondary | ICD-10-CM | POA: Diagnosis not present

## 2017-01-16 DIAGNOSIS — G609 Hereditary and idiopathic neuropathy, unspecified: Secondary | ICD-10-CM | POA: Diagnosis not present

## 2017-01-16 DIAGNOSIS — M2041 Other hammer toe(s) (acquired), right foot: Secondary | ICD-10-CM | POA: Diagnosis not present

## 2017-01-21 DIAGNOSIS — L97512 Non-pressure chronic ulcer of other part of right foot with fat layer exposed: Secondary | ICD-10-CM | POA: Diagnosis not present

## 2017-01-21 DIAGNOSIS — M2041 Other hammer toe(s) (acquired), right foot: Secondary | ICD-10-CM | POA: Diagnosis not present

## 2017-01-23 DIAGNOSIS — Z4889 Encounter for other specified surgical aftercare: Secondary | ICD-10-CM | POA: Insufficient documentation

## 2017-01-28 DIAGNOSIS — S72142A Displaced intertrochanteric fracture of left femur, initial encounter for closed fracture: Secondary | ICD-10-CM | POA: Diagnosis not present

## 2017-02-13 DIAGNOSIS — Z23 Encounter for immunization: Secondary | ICD-10-CM | POA: Diagnosis not present

## 2017-04-30 DIAGNOSIS — Z79899 Other long term (current) drug therapy: Secondary | ICD-10-CM | POA: Diagnosis not present

## 2017-04-30 DIAGNOSIS — G40909 Epilepsy, unspecified, not intractable, without status epilepticus: Secondary | ICD-10-CM | POA: Diagnosis not present

## 2017-04-30 DIAGNOSIS — Z1331 Encounter for screening for depression: Secondary | ICD-10-CM | POA: Diagnosis not present

## 2017-04-30 DIAGNOSIS — Z6824 Body mass index (BMI) 24.0-24.9, adult: Secondary | ICD-10-CM | POA: Diagnosis not present

## 2017-04-30 DIAGNOSIS — M810 Age-related osteoporosis without current pathological fracture: Secondary | ICD-10-CM | POA: Diagnosis not present

## 2017-04-30 DIAGNOSIS — E785 Hyperlipidemia, unspecified: Secondary | ICD-10-CM | POA: Diagnosis not present

## 2017-04-30 DIAGNOSIS — E559 Vitamin D deficiency, unspecified: Secondary | ICD-10-CM | POA: Diagnosis not present

## 2017-04-30 DIAGNOSIS — I10 Essential (primary) hypertension: Secondary | ICD-10-CM | POA: Diagnosis not present

## 2017-04-30 DIAGNOSIS — C3491 Malignant neoplasm of unspecified part of right bronchus or lung: Secondary | ICD-10-CM | POA: Diagnosis not present

## 2017-04-30 DIAGNOSIS — Z9181 History of falling: Secondary | ICD-10-CM | POA: Diagnosis not present

## 2017-05-08 DIAGNOSIS — M2041 Other hammer toe(s) (acquired), right foot: Secondary | ICD-10-CM | POA: Diagnosis not present

## 2017-05-08 DIAGNOSIS — L603 Nail dystrophy: Secondary | ICD-10-CM | POA: Diagnosis not present

## 2017-05-08 DIAGNOSIS — G609 Hereditary and idiopathic neuropathy, unspecified: Secondary | ICD-10-CM | POA: Diagnosis not present

## 2017-05-16 DIAGNOSIS — N401 Enlarged prostate with lower urinary tract symptoms: Secondary | ICD-10-CM | POA: Diagnosis not present

## 2017-05-16 DIAGNOSIS — R351 Nocturia: Secondary | ICD-10-CM | POA: Diagnosis not present

## 2017-06-18 DIAGNOSIS — N401 Enlarged prostate with lower urinary tract symptoms: Secondary | ICD-10-CM | POA: Diagnosis not present

## 2017-06-18 DIAGNOSIS — Z125 Encounter for screening for malignant neoplasm of prostate: Secondary | ICD-10-CM | POA: Diagnosis not present

## 2017-06-18 DIAGNOSIS — R351 Nocturia: Secondary | ICD-10-CM | POA: Diagnosis not present

## 2017-07-04 DIAGNOSIS — Z1331 Encounter for screening for depression: Secondary | ICD-10-CM | POA: Diagnosis not present

## 2017-07-04 DIAGNOSIS — Z0000 Encounter for general adult medical examination without abnormal findings: Secondary | ICD-10-CM | POA: Diagnosis not present

## 2017-07-04 DIAGNOSIS — Z136 Encounter for screening for cardiovascular disorders: Secondary | ICD-10-CM | POA: Diagnosis not present

## 2017-07-04 DIAGNOSIS — Z125 Encounter for screening for malignant neoplasm of prostate: Secondary | ICD-10-CM | POA: Diagnosis not present

## 2017-07-04 DIAGNOSIS — Z9181 History of falling: Secondary | ICD-10-CM | POA: Diagnosis not present

## 2017-07-04 DIAGNOSIS — E785 Hyperlipidemia, unspecified: Secondary | ICD-10-CM | POA: Diagnosis not present

## 2017-07-10 DIAGNOSIS — C44319 Basal cell carcinoma of skin of other parts of face: Secondary | ICD-10-CM | POA: Diagnosis not present

## 2017-07-10 DIAGNOSIS — L821 Other seborrheic keratosis: Secondary | ICD-10-CM | POA: Diagnosis not present

## 2017-07-10 DIAGNOSIS — L719 Rosacea, unspecified: Secondary | ICD-10-CM | POA: Diagnosis not present

## 2017-07-10 DIAGNOSIS — C4442 Squamous cell carcinoma of skin of scalp and neck: Secondary | ICD-10-CM | POA: Diagnosis not present

## 2017-07-18 DIAGNOSIS — C44329 Squamous cell carcinoma of skin of other parts of face: Secondary | ICD-10-CM | POA: Diagnosis not present

## 2017-07-23 DIAGNOSIS — C4441 Basal cell carcinoma of skin of scalp and neck: Secondary | ICD-10-CM | POA: Diagnosis not present

## 2017-08-15 DIAGNOSIS — I10 Essential (primary) hypertension: Secondary | ICD-10-CM | POA: Diagnosis not present

## 2017-08-15 DIAGNOSIS — H6121 Impacted cerumen, right ear: Secondary | ICD-10-CM | POA: Diagnosis not present

## 2017-08-15 DIAGNOSIS — E785 Hyperlipidemia, unspecified: Secondary | ICD-10-CM | POA: Diagnosis not present

## 2017-08-15 DIAGNOSIS — E559 Vitamin D deficiency, unspecified: Secondary | ICD-10-CM | POA: Diagnosis not present

## 2017-08-15 DIAGNOSIS — G40909 Epilepsy, unspecified, not intractable, without status epilepticus: Secondary | ICD-10-CM | POA: Diagnosis not present

## 2017-08-21 DIAGNOSIS — L719 Rosacea, unspecified: Secondary | ICD-10-CM | POA: Diagnosis not present

## 2017-08-21 DIAGNOSIS — L309 Dermatitis, unspecified: Secondary | ICD-10-CM | POA: Diagnosis not present

## 2017-09-18 DIAGNOSIS — L719 Rosacea, unspecified: Secondary | ICD-10-CM | POA: Diagnosis not present

## 2017-09-18 DIAGNOSIS — L309 Dermatitis, unspecified: Secondary | ICD-10-CM | POA: Diagnosis not present

## 2017-10-16 DIAGNOSIS — R972 Elevated prostate specific antigen [PSA]: Secondary | ICD-10-CM | POA: Diagnosis not present

## 2017-10-16 DIAGNOSIS — N401 Enlarged prostate with lower urinary tract symptoms: Secondary | ICD-10-CM | POA: Diagnosis not present

## 2017-10-30 DIAGNOSIS — Z79899 Other long term (current) drug therapy: Secondary | ICD-10-CM | POA: Diagnosis not present

## 2017-10-30 DIAGNOSIS — E785 Hyperlipidemia, unspecified: Secondary | ICD-10-CM | POA: Diagnosis not present

## 2017-10-30 DIAGNOSIS — G40909 Epilepsy, unspecified, not intractable, without status epilepticus: Secondary | ICD-10-CM | POA: Diagnosis not present

## 2017-10-30 DIAGNOSIS — I10 Essential (primary) hypertension: Secondary | ICD-10-CM | POA: Diagnosis not present

## 2017-10-30 DIAGNOSIS — C3491 Malignant neoplasm of unspecified part of right bronchus or lung: Secondary | ICD-10-CM | POA: Diagnosis not present

## 2017-10-30 DIAGNOSIS — E559 Vitamin D deficiency, unspecified: Secondary | ICD-10-CM | POA: Diagnosis not present

## 2017-11-06 DIAGNOSIS — M2041 Other hammer toe(s) (acquired), right foot: Secondary | ICD-10-CM | POA: Diagnosis not present

## 2017-11-06 DIAGNOSIS — G609 Hereditary and idiopathic neuropathy, unspecified: Secondary | ICD-10-CM | POA: Diagnosis not present

## 2017-11-06 DIAGNOSIS — M2042 Other hammer toe(s) (acquired), left foot: Secondary | ICD-10-CM | POA: Diagnosis not present

## 2017-11-25 DIAGNOSIS — M810 Age-related osteoporosis without current pathological fracture: Secondary | ICD-10-CM | POA: Diagnosis not present

## 2017-12-30 DIAGNOSIS — M810 Age-related osteoporosis without current pathological fracture: Secondary | ICD-10-CM | POA: Diagnosis not present

## 2018-01-31 IMAGING — CT CT CHEST W/O CM
2 of 3 series · 15 of 36 positions shown, 18 images · non-contrast
Comparison: 10/18/2015

CLINICAL DATA: Routine follow-up of lung nodule

EXAM:
CT CHEST WITHOUT CONTRAST
TECHNIQUE: Multidetector CT imaging of the chest was performed following the
standard protocol without IV contrast.

[Series 2: thorax · axial · 0.78mm/px · z∈[-244,+16]mm · 12 of 154 slices shown, 15 images]
[im 12/154  mediastinal]
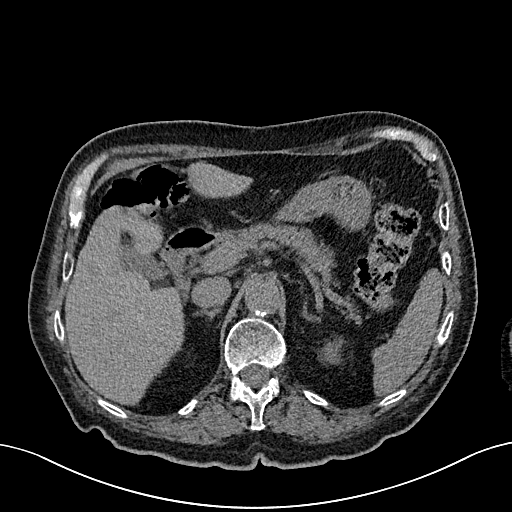
[im 12/154  lung]
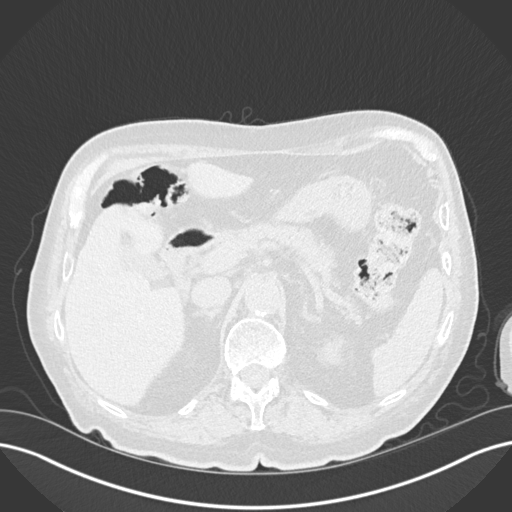
[im 23/154  lung]
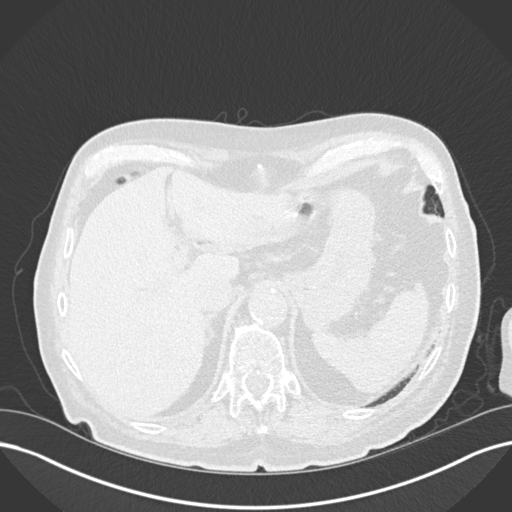
[im 35/154  lung]
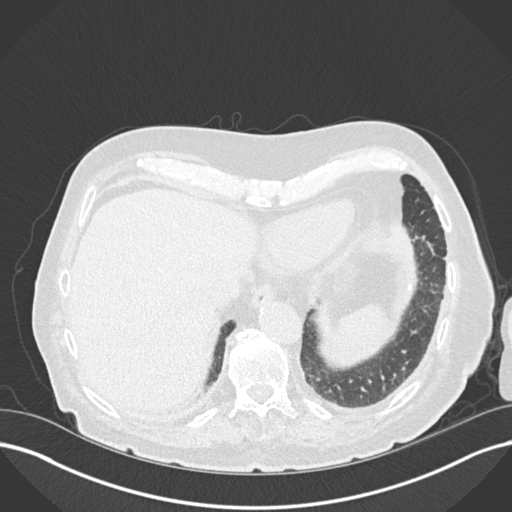
[im 46/154  lung]
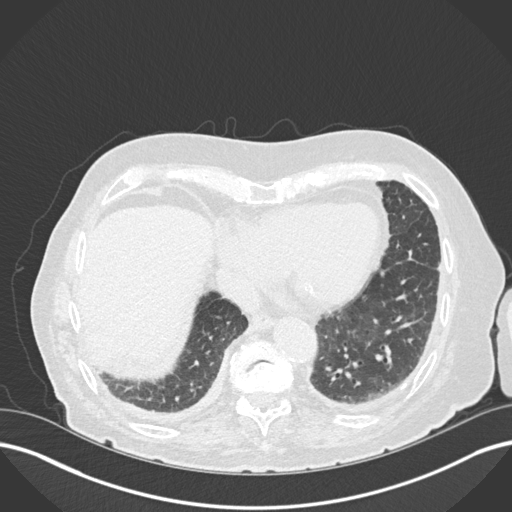
[im 57/154  mediastinal]
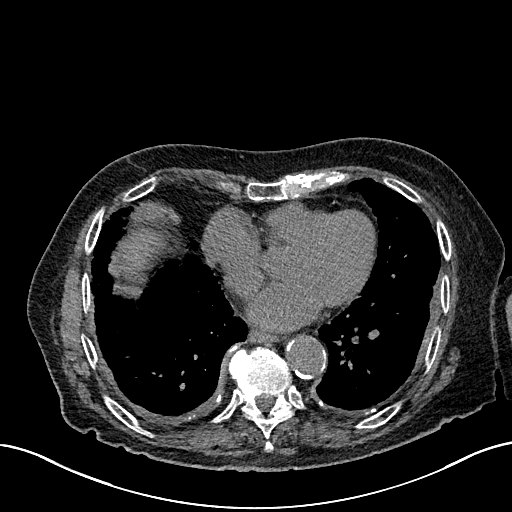
[im 57/154  lung]
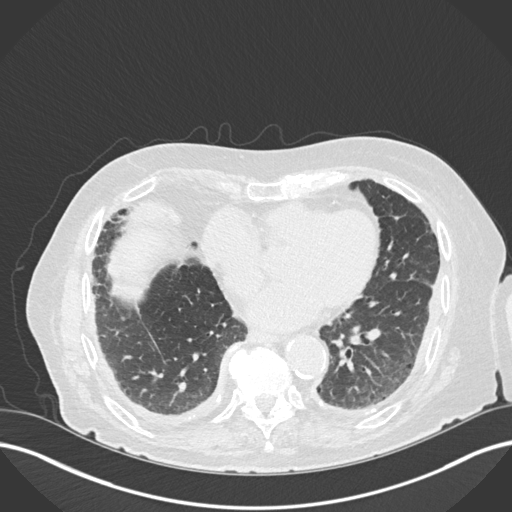
[im 69/154  lung]
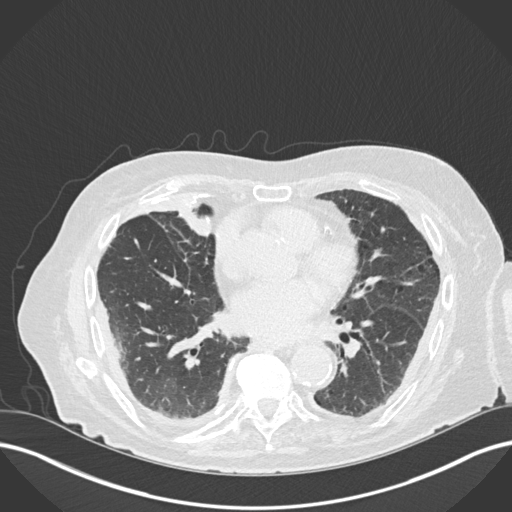
[im 86/154  lung]
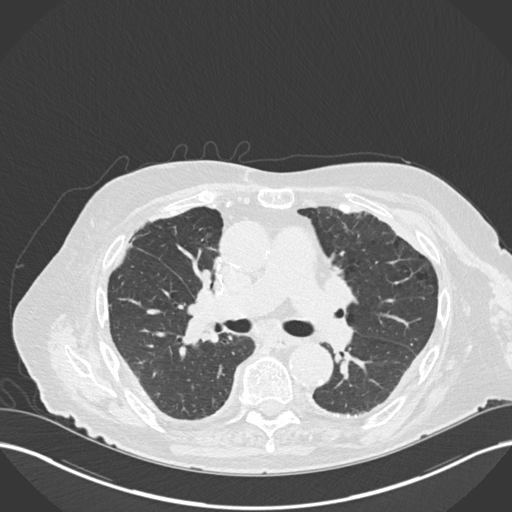
[im 97/154  lung]
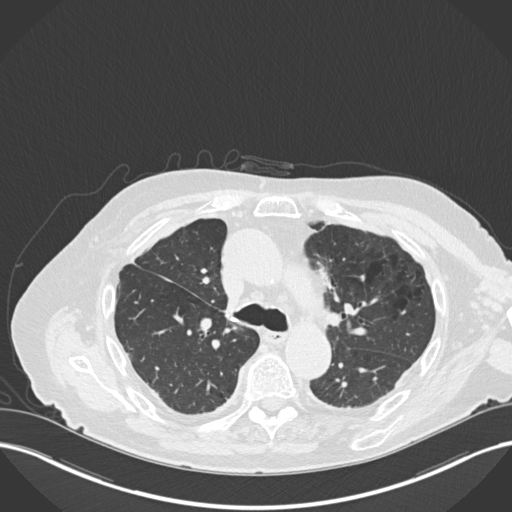
[im 108/154  mediastinal]
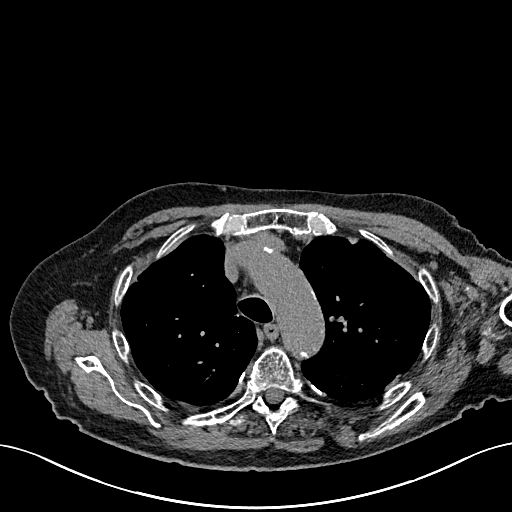
[im 108/154  lung]
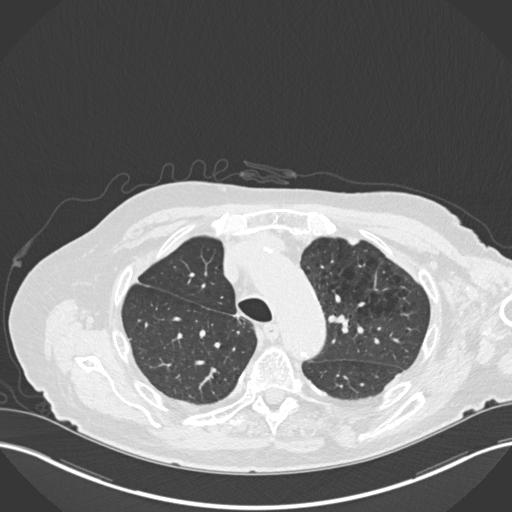
[im 120/154  lung]
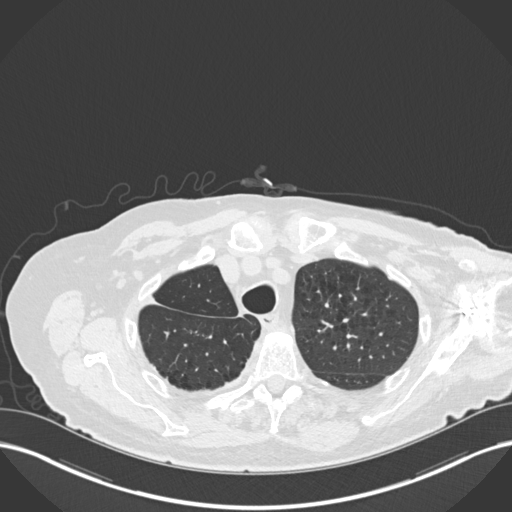
[im 131/154  lung]
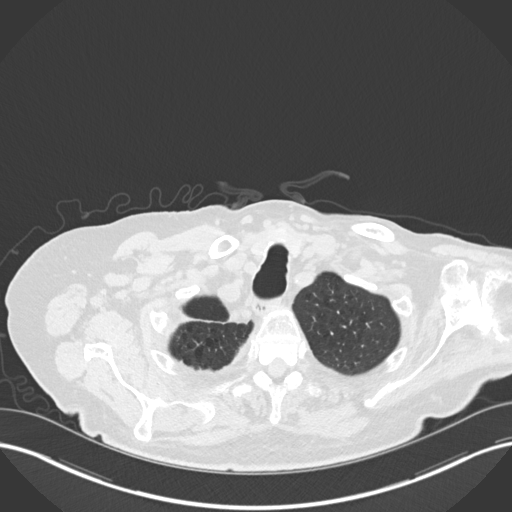
[im 142/154  lung]
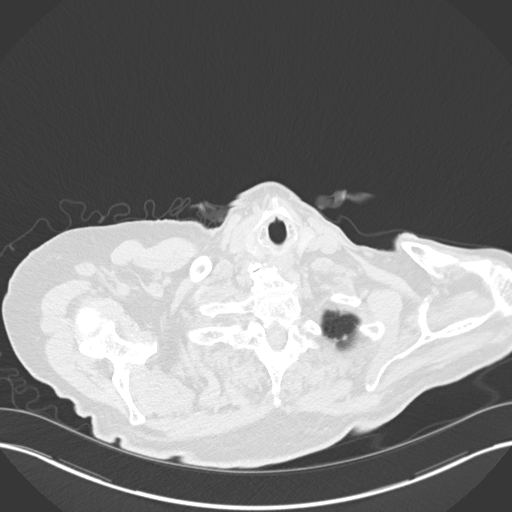

[Series 5: coronal · coronal · 0.60mm/px · 3 of 121 slices shown]
[im 25/121  lung]
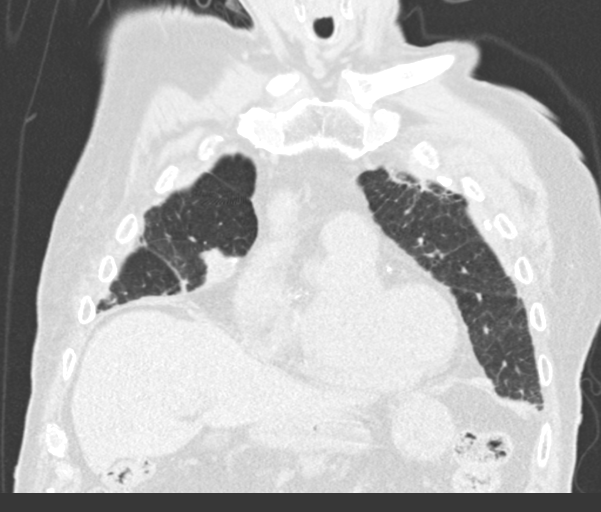
[im 49/121  lung]
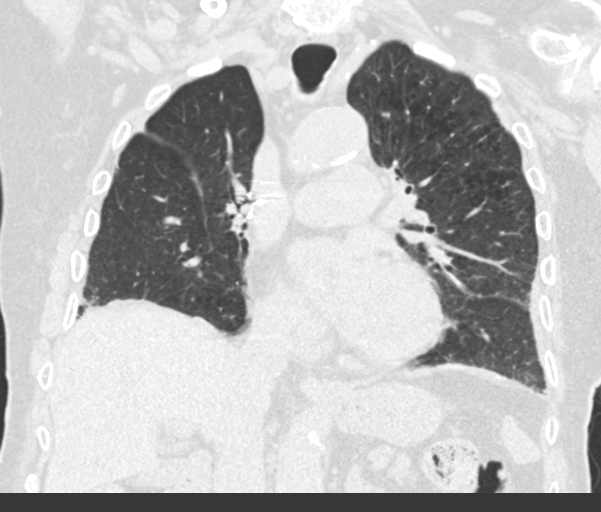
[im 73/121  lung]
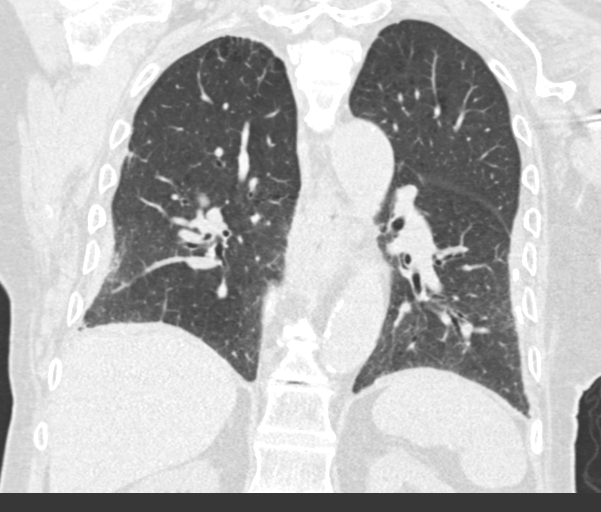

[15 of 36 positions shown; findings below may reference images not displayed]

FINDINGS: Cardiovascular: The heart size appears within normal limits. Aortic
atherosclerosis. Calcifications involving the RCA, LAD and left
circumflex coronary artery.

Mediastinum/Nodes: The trachea appears patent and is midline. Normal
appearance of the esophagus.

Lungs/Pleura: Moderate changes of centrilobular emphysema. Diffuse
bronchial wall thickening noted. Status post right upper lobectomy.
Bilateral pleural thickening which appears partially calcified
unchanged from previous exam. Anterior medial left upper lobe
density is stable measuring 3.2 x 1.7 cm, image 64 of series 3.
Right middle lobe bandlike area of partially calcified soft tissue
attenuation measures 4.2 x 1.4 cm and is stable from previous exam.
No new pulmonary abnormalities. Calcification

Upper Abdomen: No acute abnormality.

Musculoskeletal: Degenerative disc disease is identified within the
thoracic spine. Chronic compression deformity involving the T3
vertebra is identified and appears unchanged from previous exam.
IMPRESSION: 1. Diffuse bronchial wall thickening with emphysema, as above;
imaging findings suggestive of underlying COPD.
2. Stable appearance of subsegmental areas of chronic
consolidation/scarring involving the anterior medial left upper lobe
and right middle lobe.
3. Aortic atherosclerosis and coronary artery calcifications.
4. Chronic T3 compression fracture

## 2018-02-17 DIAGNOSIS — Z23 Encounter for immunization: Secondary | ICD-10-CM | POA: Diagnosis not present

## 2018-02-19 DIAGNOSIS — L723 Sebaceous cyst: Secondary | ICD-10-CM | POA: Diagnosis not present

## 2018-02-19 DIAGNOSIS — N401 Enlarged prostate with lower urinary tract symptoms: Secondary | ICD-10-CM | POA: Diagnosis not present

## 2018-02-19 DIAGNOSIS — N492 Inflammatory disorders of scrotum: Secondary | ICD-10-CM | POA: Diagnosis not present

## 2018-04-21 DIAGNOSIS — N401 Enlarged prostate with lower urinary tract symptoms: Secondary | ICD-10-CM | POA: Diagnosis not present

## 2018-04-21 DIAGNOSIS — R351 Nocturia: Secondary | ICD-10-CM | POA: Diagnosis not present

## 2018-05-07 DIAGNOSIS — G40909 Epilepsy, unspecified, not intractable, without status epilepticus: Secondary | ICD-10-CM | POA: Diagnosis not present

## 2018-05-07 DIAGNOSIS — Z79899 Other long term (current) drug therapy: Secondary | ICD-10-CM | POA: Diagnosis not present

## 2018-05-07 DIAGNOSIS — E559 Vitamin D deficiency, unspecified: Secondary | ICD-10-CM | POA: Diagnosis not present

## 2018-05-07 DIAGNOSIS — I10 Essential (primary) hypertension: Secondary | ICD-10-CM | POA: Diagnosis not present

## 2018-05-07 DIAGNOSIS — E785 Hyperlipidemia, unspecified: Secondary | ICD-10-CM | POA: Diagnosis not present

## 2018-05-07 DIAGNOSIS — C3491 Malignant neoplasm of unspecified part of right bronchus or lung: Secondary | ICD-10-CM | POA: Diagnosis not present

## 2018-05-14 DIAGNOSIS — I700 Atherosclerosis of aorta: Secondary | ICD-10-CM | POA: Diagnosis not present

## 2018-05-14 DIAGNOSIS — C3491 Malignant neoplasm of unspecified part of right bronchus or lung: Secondary | ICD-10-CM | POA: Diagnosis not present

## 2018-05-14 DIAGNOSIS — J949 Pleural condition, unspecified: Secondary | ICD-10-CM | POA: Diagnosis not present

## 2018-05-14 DIAGNOSIS — J432 Centrilobular emphysema: Secondary | ICD-10-CM | POA: Diagnosis not present

## 2018-07-04 DIAGNOSIS — Z79899 Other long term (current) drug therapy: Secondary | ICD-10-CM | POA: Diagnosis not present

## 2018-07-07 DIAGNOSIS — M810 Age-related osteoporosis without current pathological fracture: Secondary | ICD-10-CM | POA: Diagnosis not present

## 2018-11-12 DIAGNOSIS — M25472 Effusion, left ankle: Secondary | ICD-10-CM | POA: Diagnosis not present

## 2018-11-12 DIAGNOSIS — M85872 Other specified disorders of bone density and structure, left ankle and foot: Secondary | ICD-10-CM | POA: Diagnosis not present

## 2018-11-12 DIAGNOSIS — M25572 Pain in left ankle and joints of left foot: Secondary | ICD-10-CM | POA: Diagnosis not present

## 2018-11-12 DIAGNOSIS — M722 Plantar fascial fibromatosis: Secondary | ICD-10-CM | POA: Diagnosis not present

## 2018-11-18 ENCOUNTER — Other Ambulatory Visit: Payer: Self-pay | Admitting: Podiatry

## 2018-11-18 ENCOUNTER — Other Ambulatory Visit: Payer: Self-pay

## 2018-11-18 ENCOUNTER — Telehealth: Payer: Self-pay | Admitting: *Deleted

## 2018-11-18 ENCOUNTER — Encounter: Payer: Self-pay | Admitting: Podiatry

## 2018-11-18 ENCOUNTER — Ambulatory Visit (INDEPENDENT_AMBULATORY_CARE_PROVIDER_SITE_OTHER): Payer: Medicare Other | Admitting: Podiatry

## 2018-11-18 ENCOUNTER — Ambulatory Visit (INDEPENDENT_AMBULATORY_CARE_PROVIDER_SITE_OTHER): Payer: Medicare Other

## 2018-11-18 VITALS — Temp 98.6°F | Resp 16

## 2018-11-18 DIAGNOSIS — R600 Localized edema: Secondary | ICD-10-CM | POA: Diagnosis not present

## 2018-11-18 DIAGNOSIS — M84375A Stress fracture, left foot, initial encounter for fracture: Secondary | ICD-10-CM

## 2018-11-18 DIAGNOSIS — M79672 Pain in left foot: Secondary | ICD-10-CM

## 2018-11-18 DIAGNOSIS — S92012A Displaced fracture of body of left calcaneus, initial encounter for closed fracture: Secondary | ICD-10-CM

## 2018-11-18 MED ORDER — HYDROCODONE-ACETAMINOPHEN 5-325 MG PO TABS: 1.0000 | ORAL_TABLET | Freq: Four times a day (QID) | ORAL | 0 refills | Status: DC | PRN

## 2018-11-18 NOTE — Telephone Encounter (Signed)
Pt states he would like to know when Dr. March Rummage would be back in the office.

## 2018-11-18 NOTE — Telephone Encounter (Signed)
Patient was wanting to know how long he would have to wait when he arrived for his appointment.  Call came in an hour before his appointment

## 2018-11-18 NOTE — Patient Instructions (Signed)

## 2018-11-18 NOTE — Progress Notes (Signed)
Subjective:  Patient ID: Jay Warren, male    DOB: May 24, 1934,  MRN: [id number]  Chief Complaint  Patient presents with  . Foot Pain    Lt bottom heel pain and swelling Pt. states," it felt like stepping on a rock and after that the pain started all over the foot and plus the swelling." tx: IBU -worse with walking -pt deneis N/V/F?CH     85 y.o. male presents with the above complaint. Hx as above.   Review of Systems: Negative except as noted in the HPI. Denies N/V/F/Ch.  Past Medical History:  Diagnosis Date  . BPH (benign prostatic hyperplasia)   . Brain cancer ([place])   . Hematuria   . Hyperlipidemia   . Hypertension   . Melkersson-Rosenthal syndrome   . MGUS (monoclonal gammopathy of unknown significance)   . Non-small cell lung cancer ([place])   . Osteoporosis   . Peripheral neuropathy   . Rosacea   . Seizure ([place])   . Vitamin D deficiency     Current Outpatient Medications:  .  alendronate (FOSAMAX) 70 MG tablet, Take 1 tablet (70 mg total) by mouth once a week. Take with a full glass of water on an empty stomach., Disp: 4 tablet, Rfl: 0 .  atorvastatin (LIPITOR) 40 MG tablet, , Disp: , Rfl:  .  bisoprolol-hydrochlorothiazide (ZIAC) 10-6.25 MG tablet, Take 1 tablet by mouth daily., Disp: 30 tablet, Rfl: 0 .  calcium carbonate (OS-CAL) 600 MG tablet, Take 2 tablets (1,200 mg total) by mouth daily., Disp: 60 tablet, Rfl: 0 .  cephALEXin (KEFLEX) 500 MG capsule, Take 1 capsule (500 mg total) by mouth 2 (two) times daily., Disp: 14 capsule, Rfl: 0 .  Cholecalciferol (VITAMIN D3 MAXIMUM STRENGTH) 5000 units capsule, Take 1 capsule (5,000 Units total) by mouth daily., Disp: 30 capsule, Rfl: 0 .  furosemide (LASIX) 40 MG tablet, , Disp: , Rfl:  .  gabapentin (NEURONTIN) 100 MG capsule, , Disp: , Rfl:  .  HYDROcodone-acetaminophen (NORCO) 5-325 MG tablet, Take 1 tablet by mouth every 6 (six) hours as needed for moderate pain., Disp: 12 tablet, Rfl: 0 .  losartan (COZAAR) 50 MG  tablet, Take 1 tablet (50 mg total) by mouth daily., Disp: 30 tablet, Rfl: 0 .  Multiple Vitamins-Minerals (MULTIVITAMIN) tablet, Take 1 tablet by mouth daily., Disp: 30 tablet, Rfl: 0 .  oxyCODONE-acetaminophen (PERCOCET) 5-325 MG tablet, Take 1 tablet by mouth every 4 (four) hours as needed for severe pain., Disp: 20 tablet, Rfl: 0 .  phenytoin (DILANTIN) 100 MG ER capsule, Take 1 capsule (100 mg total) by mouth 3 (three) times daily., Disp: 90 capsule, Rfl: 0 .  simvastatin (ZOCOR) 80 MG tablet, Take 1 tablet (80 mg total) by mouth at bedtime., Disp: 30 tablet, Rfl: 0  Social History   Tobacco Use  Smoking Status Former Smoker  . Packs/day: 2.00  . Years: 30.00  . Pack years: 60.00  . Types: Cigarettes  . Quit date: 06/04/1994  . Years since quitting: 24.5  Smokeless Tobacco Never Used    No Known Allergies Objective:   Vitals:   11/18/18 1343  Resp: 16  Temp: 98.6 F (37 C)   There is no height or weight on file to calculate BMI. Constitutional Well developed. Well nourished.  Vascular Dorsalis pedis pulses palpable bilaterally. Posterior tibial pulses palpable bilaterally. Capillary refill normal to all digits.  No cyanosis or clubbing noted. Pedal hair growth normal.  Neurologic Normal speech. Oriented to person, place, and  time. Epicritic sensation to light touch grossly present bilaterally.  Dermatologic Nails well groomed and normal in appearance. No open wounds. No skin tenting or necrosis. No skin lesions.  Orthopedic: POP left heel, pain with calcaneal squeeze.   Radiographs: XR from [facility] reviewed.  Slight irregularity at the inferior border of the calcaneus.  New x-rays today show calcaneus fracture with placement of the posterior tuber Assessment:   1. Closed displaced fracture of body of left calcaneus, initial encounter   2. Pain of left heel   3. Stress fracture of left calcaneus, initial encounter   4. Localized edema    Plan:  Patient was evaluated  and treated and all questions answered.  Calcaneus Fracture LLE -XR Reviewed -Order CT LLE for further review and the fracture -Cam boot dispensed -Strict nonweightbearing left lower extremity -Compression dressing applied today for reduction of edema post injury  No follow-ups on file.

## 2018-11-18 NOTE — Progress Notes (Signed)
   Subjective:    Patient ID: Jay Warren, male    DOB: [birth year], 85 y.o.   MRN: [id number]  HPI    Review of Systems  Musculoskeletal: Positive for arthralgias, gait problem, joint swelling and myalgias.  All other systems reviewed and are negative.      Objective:   Physical Exam        Assessment & Plan:

## 2018-11-19 ENCOUNTER — Telehealth: Payer: Self-pay | Admitting: *Deleted

## 2018-11-19 DIAGNOSIS — S92012A Displaced fracture of body of left calcaneus, initial encounter for closed fracture: Secondary | ICD-10-CM

## 2018-11-19 NOTE — Telephone Encounter (Addendum)
[facility] Jay Warren scheduled CT wo contrast [id number] left lower ankle on 11/21/2018 arrive 2:30pm for 3:00pm imaging. Faxed orders to [facility] and contacted pt with appt time.

## 2018-11-19 NOTE — Telephone Encounter (Signed)
-----   Message from Evelina Bucy, DPM sent at 11/18/2018  6:21 PM EDT ----- Can we order CT without contrast LLE for Calcaneus Fracture?

## 2018-11-21 DIAGNOSIS — S92192D Other fracture of left talus, subsequent encounter for fracture with routine healing: Secondary | ICD-10-CM | POA: Diagnosis not present

## 2018-11-21 DIAGNOSIS — S92002D Unspecified fracture of left calcaneus, subsequent encounter for fracture with routine healing: Secondary | ICD-10-CM | POA: Diagnosis not present

## 2018-11-21 DIAGNOSIS — S92012A Displaced fracture of body of left calcaneus, initial encounter for closed fracture: Secondary | ICD-10-CM | POA: Diagnosis not present

## 2018-11-25 ENCOUNTER — Ambulatory Visit (INDEPENDENT_AMBULATORY_CARE_PROVIDER_SITE_OTHER): Payer: Medicare Other | Admitting: Podiatry

## 2018-11-25 ENCOUNTER — Encounter: Payer: Self-pay | Admitting: Podiatry

## 2018-11-25 ENCOUNTER — Other Ambulatory Visit: Payer: Self-pay

## 2018-11-25 ENCOUNTER — Telehealth: Payer: Self-pay | Admitting: *Deleted

## 2018-11-25 VITALS — Temp 97.3°F | Resp 16

## 2018-11-25 DIAGNOSIS — L89626 Pressure-induced deep tissue damage of left heel: Secondary | ICD-10-CM | POA: Diagnosis not present

## 2018-11-25 DIAGNOSIS — E559 Vitamin D deficiency, unspecified: Secondary | ICD-10-CM | POA: Diagnosis not present

## 2018-11-25 DIAGNOSIS — M79672 Pain in left foot: Secondary | ICD-10-CM | POA: Diagnosis not present

## 2018-11-25 DIAGNOSIS — G6289 Other specified polyneuropathies: Secondary | ICD-10-CM

## 2018-11-25 DIAGNOSIS — S92012A Displaced fracture of body of left calcaneus, initial encounter for closed fracture: Secondary | ICD-10-CM

## 2018-11-25 NOTE — Progress Notes (Signed)
Subjective:  Patient ID: Jay Warren, male    DOB: 06-May-1934,  MRN: [id number]  Chief Complaint  Patient presents with  . Fracture    F/U Lt calcanueus fx: pt. states," pain is the same (6/10), but swellin at the ankle has gone down, but there's a sore form the boot on my heel." Tx: cam boot, IBU and tylenol -pt denies N/V?F/Ch     85 y.o. male presents with the above complaint.  Pain about the same.  Swelling is reduced   Review of Systems: Negative except as noted in the HPI. Denies N/V/F/Ch.  Past Medical History:  Diagnosis Date  . BPH (benign prostatic hyperplasia)   . Brain cancer ([place])   . Hematuria   . Hyperlipidemia   . Hypertension   . Melkersson-Rosenthal syndrome   . MGUS (monoclonal gammopathy of unknown significance)   . Non-small cell lung cancer ([place])   . Osteoporosis   . Peripheral neuropathy   . Rosacea   . Seizure ([place])   . Vitamin D deficiency     Current Outpatient Medications:  .  alendronate (FOSAMAX) 70 MG tablet, Take 1 tablet (70 mg total) by mouth once a week. Take with a full glass of water on an empty stomach., Disp: 4 tablet, Rfl: 0 .  atorvastatin (LIPITOR) 40 MG tablet, , Disp: , Rfl:  .  bisoprolol-hydrochlorothiazide (ZIAC) 10-6.25 MG tablet, Take 1 tablet by mouth daily., Disp: 30 tablet, Rfl: 0 .  calcium carbonate (OS-CAL) 600 MG tablet, Take 2 tablets (1,200 mg total) by mouth daily., Disp: 60 tablet, Rfl: 0 .  Cholecalciferol (VITAMIN D3 MAXIMUM STRENGTH) 5000 units capsule, Take 1 capsule (5,000 Units total) by mouth daily., Disp: 30 capsule, Rfl: 0 .  furosemide (LASIX) 40 MG tablet, , Disp: , Rfl:  .  gabapentin (NEURONTIN) 100 MG capsule, , Disp: , Rfl:  .  HYDROcodone-acetaminophen (NORCO) 5-325 MG tablet, Take 1 tablet by mouth every 6 (six) hours as needed for moderate pain., Disp: 12 tablet, Rfl: 0 .  losartan (COZAAR) 50 MG tablet, Take 1 tablet (50 mg total) by mouth daily., Disp: 30 tablet, Rfl: 0 .  Multiple  Vitamins-Minerals (MULTIVITAMIN) tablet, Take 1 tablet by mouth daily., Disp: 30 tablet, Rfl: 0 .  phenytoin (DILANTIN) 100 MG ER capsule, Take 1 capsule (100 mg total) by mouth 3 (three) times daily., Disp: 90 capsule, Rfl: 0 .  simvastatin (ZOCOR) 80 MG tablet, Take 1 tablet (80 mg total) by mouth at bedtime., Disp: 30 tablet, Rfl: 0  Social History   Tobacco Use  Smoking Status Former Smoker  . Packs/day: 2.00  . Years: 30.00  . Pack years: 60.00  . Types: Cigarettes  . Quit date: 06/04/1994  . Years since quitting: 24.4  Smokeless Tobacco Never Used    No Known Allergies Objective:   Vitals:   11/25/18 1106  Resp: 16  Temp: (!) 97.3 F (36.3 C)   There is no height or weight on file to calculate BMI. Constitutional Well developed. Well nourished.  Vascular Dorsalis pedis pulses palpable bilaterally. Posterior tibial pulses palpable bilaterally. Capillary refill normal to all digits.  No cyanosis or clubbing noted. Pedal hair growth normal.  Neurologic Normal speech. Oriented to person, place, and time. Epicritic sensation to light touch grossly present bilaterally.  Dermatologic Nails well groomed and normal in appearance. Skin discoloration posterior inferior calcaneus but with capillary refill.  No skin tenting No skin lesions.  Orthopedic:  Pain palpation about the calcaneus left.  Achilles intact.   Radiographs: CT reviewed.  Assessment:   1. Closed displaced fracture of body of left calcaneus, initial encounter   2. Pain of left heel   3. Pressure injury of deep tissue of left heel   4. Vitamin D deficiency   5. Other polyneuropathy    Plan:  Patient was evaluated and treated and all questions answered.  Calcaneus Fracture LLE -CT reviewed. Anterior rotation of the posterior facet with avulsion type injury.  -There is some skin discoloration today without true skin tenting. I do not think he is at risk for imminent skin compromise but I do think we  should proceed soon with surgical intervention.  -Discussed with patient and his son that complete open reduction internal fixation is fraught with complication and given the patient's age comorbidities I think more minimalistic approach would be ideal.  Patient verbalized understanding.  We will plan for mini open procedure with likely percutaneous pinning with possible screw fixation.  Plan for procedure be performed tomorrow.  Discussed the patient and his son that the goal of surgery is simply to improve the alignment of the posterior facet and reduce the risk of skin complication.  Patient and son verbalized understanding and agreement.  30 minutes of face to face time were spent with the patient. >50% of this was spent on counseling and coordination of care. Specifically discussed with patient the above diagnoses and overall treatment plan.   Return in about 2 weeks (around 12/09/2018).

## 2018-11-25 NOTE — Telephone Encounter (Signed)
"  Dr. March Rummage asked me to give you a call at 1:15 pm.  He's trying to do a very early surgery on my father Freddy Kinne.  I look forward to speaking with you."

## 2018-11-25 NOTE — Patient Instructions (Signed)
Pre-Operative Instructions  Congratulations, you have decided to take an important step towards improving your quality of life.  You can be assured that the doctors and staff at [facility] will be with you every step of the way.  Here are some important things you should know:  1. Plan to be at the surgery center/hospital at least 1 (one) hour prior to your scheduled time, unless otherwise directed by the surgical center/hospital staff.  You must have a responsible adult accompany you, remain during the surgery and drive you home.  Make sure you have directions to the surgical center/hospital to ensure you arrive on time. 2. If you are having surgery at [facility] or [facility] hospitals, you will need a copy of your medical history and physical form from your family physician within one month prior to the date of surgery. We will give you a form for your primary physician to complete.  3. We make every effort to accommodate the date you request for surgery.  However, there are times where surgery dates or times have to be moved.  We will contact you as soon as possible if a change in schedule is required.   4. No aspirin/ibuprofen for one week before surgery.  If you are on aspirin, any non-steroidal anti-inflammatory medications (Mobic, Aleve, Ibuprofen) should not be taken seven (7) days prior to your surgery.  You make take Tylenol for pain prior to surgery.  5. Medications - If you are taking daily heart and blood pressure medications, seizure, reflux, allergy, asthma, anxiety, pain or diabetes medications, make sure you notify the surgery center/hospital before the day of surgery so they can tell you which medications you should take or avoid the day of surgery. 6. No food or drink after midnight the night before surgery unless directed otherwise by surgical center/hospital staff. 7. No alcoholic beverages 24-hours prior to surgery.  No smoking 24-hours prior or 24-hours after  surgery. 8. Wear loose pants or shorts. They should be loose enough to fit over bandages, boots, and casts. 9. Don't wear slip-on shoes. Sneakers are preferred. 10. Bring your boot with you to the surgery center/hospital.  Also bring crutches or a walker if your physician has prescribed it for you.  If you do not have this equipment, it will be provided for you after surgery. 11. If you have not been contacted by the surgery center/hospital by the day before your surgery, call to confirm the date and time of your surgery. 12. Leave-time from work may vary depending on the type of surgery you have.  Appropriate arrangements should be made prior to surgery with your employer. 13. Prescriptions will be provided immediately following surgery by your doctor.  Fill these as soon as possible after surgery and take the medication as directed. Pain medications will not be refilled on weekends and must be approved by the doctor. 14. Remove nail polish on the operative foot and avoid getting pedicures prior to surgery. 15. Wash the night before surgery.  The night before surgery wash the foot and leg well with water and the antibacterial soap provided. Be sure to pay special attention to beneath the toenails and in between the toes.  Wash for at least three (3) minutes. Rinse thoroughly with water and dry well with a towel.  Perform this wash unless told not to do so by your physician.  Enclosed: 1 Ice pack (please put in freezer the night before surgery)   1 Hibiclens skin cleaner     Pre-op instructions  If you have any questions regarding the instructions, please do not hesitate to call our office.  [place]: [address], [place], [place] [address] -- [phone]  [place]: [address] , [place] [address] -- [phone]  [place]: [address]  [place], [place] [address] -- [phone]  [place]: [address], [address], [place], [place] [address] -- [phone]  Website:  [email or web address] 

## 2018-11-26 ENCOUNTER — Other Ambulatory Visit: Payer: Self-pay | Admitting: Podiatry

## 2018-11-26 ENCOUNTER — Encounter: Payer: Self-pay | Admitting: Podiatry

## 2018-11-26 DIAGNOSIS — I10 Essential (primary) hypertension: Secondary | ICD-10-CM | POA: Diagnosis not present

## 2018-11-26 DIAGNOSIS — X58XXXA Exposure to other specified factors, initial encounter: Secondary | ICD-10-CM | POA: Diagnosis not present

## 2018-11-26 DIAGNOSIS — S92002A Unspecified fracture of left calcaneus, initial encounter for closed fracture: Secondary | ICD-10-CM | POA: Diagnosis not present

## 2018-11-26 DIAGNOSIS — Y999 Unspecified external cause status: Secondary | ICD-10-CM | POA: Diagnosis not present

## 2018-11-26 DIAGNOSIS — Y929 Unspecified place or not applicable: Secondary | ICD-10-CM | POA: Diagnosis not present

## 2018-11-26 DIAGNOSIS — S92062A Displaced intraarticular fracture of left calcaneus, initial encounter for closed fracture: Secondary | ICD-10-CM | POA: Diagnosis not present

## 2018-11-26 DIAGNOSIS — Y939 Activity, unspecified: Secondary | ICD-10-CM | POA: Diagnosis not present

## 2018-11-26 DIAGNOSIS — S92012A Displaced fracture of body of left calcaneus, initial encounter for closed fracture: Secondary | ICD-10-CM

## 2018-11-26 MED ORDER — CEPHALEXIN 500 MG PO CAPS: 500.0000 mg | ORAL_CAPSULE | Freq: Two times a day (BID) | ORAL | 0 refills | Status: DC

## 2018-11-26 MED ORDER — OXYCODONE-ACETAMINOPHEN 5-325 MG PO TABS: 1.0000 | ORAL_TABLET | ORAL | 0 refills | Status: DC | PRN

## 2018-11-26 NOTE — Progress Notes (Signed)
Orders placed for post-op medications.  Updated orders for [place]:  Dressing check this week. Dressing to be changed by DPM next week and nursing to change next week.  Home PT PT Eval and Treat Gait training NWB LLE, 2/2 calcaneus fracture and subsequent Open reduction percutaneous pinning.  Patient is in assisted living facility

## 2018-11-27 ENCOUNTER — Other Ambulatory Visit: Payer: Self-pay | Admitting: Podiatry

## 2018-11-27 ENCOUNTER — Telehealth: Payer: Self-pay

## 2018-11-27 DIAGNOSIS — S92012D Displaced fracture of body of left calcaneus, subsequent encounter for fracture with routine healing: Secondary | ICD-10-CM | POA: Diagnosis not present

## 2018-11-27 DIAGNOSIS — I10 Essential (primary) hypertension: Secondary | ICD-10-CM | POA: Diagnosis not present

## 2018-11-27 DIAGNOSIS — M80052D Age-related osteoporosis with current pathological fracture, left femur, subsequent encounter for fracture with routine healing: Secondary | ICD-10-CM | POA: Diagnosis not present

## 2018-11-27 DIAGNOSIS — L89626 Pressure-induced deep tissue damage of left heel: Secondary | ICD-10-CM | POA: Diagnosis not present

## 2018-11-27 DIAGNOSIS — E559 Vitamin D deficiency, unspecified: Secondary | ICD-10-CM | POA: Diagnosis not present

## 2018-11-27 DIAGNOSIS — Z85118 Personal history of other malignant neoplasm of bronchus and lung: Secondary | ICD-10-CM | POA: Diagnosis not present

## 2018-11-27 DIAGNOSIS — I110 Hypertensive heart disease with heart failure: Secondary | ICD-10-CM | POA: Diagnosis not present

## 2018-11-27 DIAGNOSIS — G4089 Other seizures: Secondary | ICD-10-CM | POA: Diagnosis not present

## 2018-11-27 DIAGNOSIS — Z79891 Long term (current) use of opiate analgesic: Secondary | ICD-10-CM | POA: Diagnosis not present

## 2018-11-27 DIAGNOSIS — M810 Age-related osteoporosis without current pathological fracture: Secondary | ICD-10-CM | POA: Diagnosis not present

## 2018-11-27 DIAGNOSIS — M79672 Pain in left foot: Secondary | ICD-10-CM

## 2018-11-27 DIAGNOSIS — I509 Heart failure, unspecified: Secondary | ICD-10-CM | POA: Diagnosis not present

## 2018-11-27 DIAGNOSIS — S92012A Displaced fracture of body of left calcaneus, initial encounter for closed fracture: Secondary | ICD-10-CM

## 2018-11-27 DIAGNOSIS — W19XXXD Unspecified fall, subsequent encounter: Secondary | ICD-10-CM | POA: Diagnosis not present

## 2018-11-27 DIAGNOSIS — Z85841 Personal history of malignant neoplasm of brain: Secondary | ICD-10-CM | POA: Diagnosis not present

## 2018-11-27 DIAGNOSIS — G6289 Other specified polyneuropathies: Secondary | ICD-10-CM | POA: Diagnosis not present

## 2018-11-27 DIAGNOSIS — Z9181 History of falling: Secondary | ICD-10-CM | POA: Diagnosis not present

## 2018-11-27 DIAGNOSIS — Z79899 Other long term (current) drug therapy: Secondary | ICD-10-CM | POA: Diagnosis not present

## 2018-11-27 NOTE — Telephone Encounter (Signed)
Patient was scheduled for surgery on 11/26/2018.

## 2018-11-27 NOTE — Progress Notes (Signed)
Refaxed orders with Dr Eleanora Neighbor specfic instructions and verified it was received patient will have in take appointment 11/27/2018 in the pm

## 2018-11-27 NOTE — Telephone Encounter (Signed)
POST OP CALL-    1) General condition stated by the patient: Doing okay, some nausea, pt reports vomiting in the night but states this has ceased as of this morning.  2) Is the pt having pain? Minimal  3) Pain score:   4) Has the pt taken Rx'd pain medication, regularly or PRN? No  5) Is the pain medication giving relief?  6) Any fever, chills, nausea, or vomiting, shortness of breath or tightness in calf? None  7) Is the bandage clean, dry and intact? Yes  8) Is there excessive tightness, bleeding or drainage coming through the bandage? No  9) Did you understand all of the post op instruction sheet given? Yes  10) Any questions or concerns regarding post op care/recovery? Pt asked about using the ice pack. Instructed pt to gently apply near surgical area with the ice pack covered in a light cloth as needed for inflammation and pain. Pt instructed to remain staying off that foot.   Confirmed POV appointment with patient

## 2018-11-27 NOTE — Telephone Encounter (Signed)
Ebony Hail from [facility] called about patient Jay Warren wanting to know what type of dressing to use on Left heel and how often it's supposed to be changed. She stated he also will have PT coming in on Saturday to evaluate as well.

## 2018-11-28 NOTE — Telephone Encounter (Signed)
Noted thanks °

## 2018-11-28 NOTE — Telephone Encounter (Signed)
Called and spoke with Ebony Hail was to leave sx dressing intact states she did change the dressing because the daughter-in -law was freaking out that there was blood on the dressing.  She states that there was a pin point hole where the was blood collection but no active bleeding.  BP was 90/54 pulse 94 recommended he increase his fluids.  Please advise if any changes to orders after first post op

## 2018-11-29 DIAGNOSIS — E559 Vitamin D deficiency, unspecified: Secondary | ICD-10-CM | POA: Diagnosis not present

## 2018-11-29 DIAGNOSIS — G6289 Other specified polyneuropathies: Secondary | ICD-10-CM | POA: Diagnosis not present

## 2018-11-29 DIAGNOSIS — S92012D Displaced fracture of body of left calcaneus, subsequent encounter for fracture with routine healing: Secondary | ICD-10-CM | POA: Diagnosis not present

## 2018-11-29 DIAGNOSIS — I10 Essential (primary) hypertension: Secondary | ICD-10-CM | POA: Diagnosis not present

## 2018-11-29 DIAGNOSIS — L89626 Pressure-induced deep tissue damage of left heel: Secondary | ICD-10-CM | POA: Diagnosis not present

## 2018-11-29 DIAGNOSIS — M810 Age-related osteoporosis without current pathological fracture: Secondary | ICD-10-CM | POA: Diagnosis not present

## 2018-12-01 ENCOUNTER — Telehealth: Payer: Self-pay | Admitting: Podiatry

## 2018-12-01 NOTE — Telephone Encounter (Signed)
Gretchen from [facility] called for verbal orders for PT. Also stated pt is not able to maintain non weight bearing status on surgery foot/leg.

## 2018-12-02 ENCOUNTER — Encounter: Payer: Self-pay | Admitting: Podiatry

## 2018-12-02 ENCOUNTER — Other Ambulatory Visit: Payer: Self-pay | Admitting: Podiatry

## 2018-12-02 ENCOUNTER — Telehealth: Payer: Self-pay | Admitting: Podiatry

## 2018-12-02 ENCOUNTER — Other Ambulatory Visit: Payer: Self-pay

## 2018-12-02 ENCOUNTER — Ambulatory Visit (INDEPENDENT_AMBULATORY_CARE_PROVIDER_SITE_OTHER): Payer: Medicare Other

## 2018-12-02 ENCOUNTER — Ambulatory Visit (INDEPENDENT_AMBULATORY_CARE_PROVIDER_SITE_OTHER): Payer: Medicare Other | Admitting: Podiatry

## 2018-12-02 VITALS — Temp 98.5°F | Resp 16

## 2018-12-02 DIAGNOSIS — Z9889 Other specified postprocedural states: Secondary | ICD-10-CM

## 2018-12-02 DIAGNOSIS — S92012D Displaced fracture of body of left calcaneus, subsequent encounter for fracture with routine healing: Secondary | ICD-10-CM | POA: Diagnosis not present

## 2018-12-02 NOTE — Telephone Encounter (Signed)
Physical Therapy order needed for patient 2x wkly for 6 wks to work on [employer] and balance issues.

## 2018-12-02 NOTE — Progress Notes (Signed)
Subjective:  Patient ID: Jay Warren, male    DOB: April 25, 1934,  MRN: [id number]  Chief Complaint  Patient presents with  . Routine Post Op     POV#1 DOS 11/26/2018 ORIF CALCANEUS LT -pt states," doing good, right now no pain, the only pain I have is when I bump it.": Tx: cam boot and elevation -pt denies N/V/F/Ch     DOS: 11/26/2018 Procedure: Open reduction percutaneous pinning of left calcaneus fracture  85 y.o. male returns for post-op check.  History as above.  States that the foot is doing much better.  Dressing was changed by home health care due to bleeding  Review of Systems: Negative except as noted in the HPI. Denies N/V/F/Ch.  Past Medical History:  Diagnosis Date  . BPH (benign prostatic hyperplasia)   . Brain cancer ([place])   . Hematuria   . Hyperlipidemia   . Hypertension   . Melkersson-Rosenthal syndrome   . MGUS (monoclonal gammopathy of unknown significance)   . Non-small cell lung cancer ([place])   . Osteoporosis   . Peripheral neuropathy   . Rosacea   . Seizure ([place])   . Vitamin D deficiency     Current Outpatient Medications:  .  alendronate (FOSAMAX) 70 MG tablet, Take 1 tablet (70 mg total) by mouth once a week. Take with a full glass of water on an empty stomach., Disp: 4 tablet, Rfl: 0 .  atorvastatin (LIPITOR) 40 MG tablet, , Disp: , Rfl:  .  bisoprolol-hydrochlorothiazide (ZIAC) 10-6.25 MG tablet, Take 1 tablet by mouth daily., Disp: 30 tablet, Rfl: 0 .  calcium carbonate (OS-CAL) 600 MG tablet, Take 2 tablets (1,200 mg total) by mouth daily., Disp: 60 tablet, Rfl: 0 .  cephALEXin (KEFLEX) 500 MG capsule, Take 1 capsule (500 mg total) by mouth 2 (two) times daily., Disp: 14 capsule, Rfl: 0 .  cephALEXin (KEFLEX) 500 MG capsule, , Disp: , Rfl:  .  Cholecalciferol (VITAMIN D3 MAXIMUM STRENGTH) 5000 units capsule, Take 1 capsule (5,000 Units total) by mouth daily., Disp: 30 capsule, Rfl: 0 .  furosemide (LASIX) 40 MG tablet, , Disp: , Rfl:  .  gabapentin  (NEURONTIN) 100 MG capsule, , Disp: , Rfl:  .  HYDROcodone-acetaminophen (NORCO) 5-325 MG tablet, Take 1 tablet by mouth every 6 (six) hours as needed for moderate pain., Disp: 12 tablet, Rfl: 0 .  HYDROcodone-acetaminophen (NORCO/VICODIN) 5-325 MG tablet, , Disp: , Rfl:  .  losartan (COZAAR) 50 MG tablet, Take 1 tablet (50 mg total) by mouth daily., Disp: 30 tablet, Rfl: 0 .  Multiple Vitamins-Minerals (MULTIVITAMIN) tablet, Take 1 tablet by mouth daily., Disp: 30 tablet, Rfl: 0 .  oxyCODONE-acetaminophen (PERCOCET) 10-325 MG tablet, , Disp: , Rfl:  .  oxyCODONE-acetaminophen (PERCOCET) 5-325 MG tablet, Take 1 tablet by mouth every 4 (four) hours as needed for severe pain., Disp: 20 tablet, Rfl: 0 .  phenytoin (DILANTIN) 100 MG ER capsule, Take 1 capsule (100 mg total) by mouth 3 (three) times daily., Disp: 90 capsule, Rfl: 0 .  phenytoin (DILANTIN) 100 MG ER capsule, , Disp: , Rfl:  .  simvastatin (ZOCOR) 80 MG tablet, Take 1 tablet (80 mg total) by mouth at bedtime., Disp: 30 tablet, Rfl: 0 .  tamsulosin (FLOMAX) 0.4 MG CAPS capsule, , Disp: , Rfl:   Social History   Tobacco Use  Smoking Status Former Smoker  . Packs/day: 2.00  . Years: 30.00  . Pack years: 60.00  . Types: Cigarettes  . Quit date:  06/04/1994  . Years since quitting: 24.5  Smokeless Tobacco Never Used    No Known Allergies Objective:   Vitals:   12/02/18 1142  Resp: 16  Temp: 98.5 F (36.9 C)   There is no height or weight on file to calculate BMI. Constitutional Well developed. Well nourished.  Vascular Foot warm and well perfused. Capillary refill normal to all digits.   Neurologic Normal speech. Oriented to person, place, and time. Epicritic sensation to light touch grossly present bilaterally.  Dermatologic Skin healing well without signs of infection.  2 plantar wounds about the exit areas of the pins.  All other wounds healing well with intact suture material.  No warmth erythema signs of acute  infection.  Posterior skin breakdown appears to previous visit without evidence of worsening.  Orthopedic: Tenderness to palpation noted about the surgical site.  Decreases prior.  Edema reduced since prior.  Pain on ankle joint range of motion.  No pain with calcaneal squeeze.   Radiographs: Taken and reviewed improved alignment of the calcaneus.  Pins intact.  The superior to inferior pins do appear to be extending down the border of the calcaneus into the soft tissue. Assessment:   1. Post-operative state    Plan:  Patient was evaluated and treated and all questions answered.  S/p foot surgery left -Progressing as expected post-operatively. -XR: As above -WB Status: NWB in CAM boot with wheelchair -Sutures: intact -Superior to inferior pins pulled as they were loose and pistoning. Concern for infection if left intact. -Medications: none -Foot redressed.  No follow-ups on file.

## 2018-12-03 NOTE — Telephone Encounter (Signed)
Updated PT orders with [name]

## 2018-12-03 NOTE — Telephone Encounter (Signed)
Unable to reach Renee at number given phone just rings called main office number.   Spoke with Renee adjusted wound care orders per Dr March Rummage to cleansing wound use foam and dry dressing.  Discontinue compression dressing to legs.  Physical therapy to treat as needed until released to weightbearing then resume balance and gait training

## 2018-12-04 DIAGNOSIS — M810 Age-related osteoporosis without current pathological fracture: Secondary | ICD-10-CM | POA: Diagnosis not present

## 2018-12-04 DIAGNOSIS — E559 Vitamin D deficiency, unspecified: Secondary | ICD-10-CM | POA: Diagnosis not present

## 2018-12-04 DIAGNOSIS — I10 Essential (primary) hypertension: Secondary | ICD-10-CM | POA: Diagnosis not present

## 2018-12-04 DIAGNOSIS — G6289 Other specified polyneuropathies: Secondary | ICD-10-CM | POA: Diagnosis not present

## 2018-12-04 DIAGNOSIS — L89626 Pressure-induced deep tissue damage of left heel: Secondary | ICD-10-CM | POA: Diagnosis not present

## 2018-12-04 DIAGNOSIS — S92012D Displaced fracture of body of left calcaneus, subsequent encounter for fracture with routine healing: Secondary | ICD-10-CM | POA: Diagnosis not present

## 2018-12-05 DIAGNOSIS — S92012D Displaced fracture of body of left calcaneus, subsequent encounter for fracture with routine healing: Secondary | ICD-10-CM | POA: Diagnosis not present

## 2018-12-05 DIAGNOSIS — G6289 Other specified polyneuropathies: Secondary | ICD-10-CM | POA: Diagnosis not present

## 2018-12-05 DIAGNOSIS — I10 Essential (primary) hypertension: Secondary | ICD-10-CM | POA: Diagnosis not present

## 2018-12-05 DIAGNOSIS — E559 Vitamin D deficiency, unspecified: Secondary | ICD-10-CM | POA: Diagnosis not present

## 2018-12-05 DIAGNOSIS — M810 Age-related osteoporosis without current pathological fracture: Secondary | ICD-10-CM | POA: Diagnosis not present

## 2018-12-05 DIAGNOSIS — L89626 Pressure-induced deep tissue damage of left heel: Secondary | ICD-10-CM | POA: Diagnosis not present

## 2018-12-08 DIAGNOSIS — L89626 Pressure-induced deep tissue damage of left heel: Secondary | ICD-10-CM | POA: Diagnosis not present

## 2018-12-08 DIAGNOSIS — G6289 Other specified polyneuropathies: Secondary | ICD-10-CM | POA: Diagnosis not present

## 2018-12-08 DIAGNOSIS — I10 Essential (primary) hypertension: Secondary | ICD-10-CM | POA: Diagnosis not present

## 2018-12-08 DIAGNOSIS — E559 Vitamin D deficiency, unspecified: Secondary | ICD-10-CM | POA: Diagnosis not present

## 2018-12-08 DIAGNOSIS — M810 Age-related osteoporosis without current pathological fracture: Secondary | ICD-10-CM | POA: Diagnosis not present

## 2018-12-08 DIAGNOSIS — S92012D Displaced fracture of body of left calcaneus, subsequent encounter for fracture with routine healing: Secondary | ICD-10-CM | POA: Diagnosis not present

## 2018-12-09 ENCOUNTER — Ambulatory Visit (INDEPENDENT_AMBULATORY_CARE_PROVIDER_SITE_OTHER): Payer: Medicare Other | Admitting: Podiatry

## 2018-12-09 ENCOUNTER — Ambulatory Visit (INDEPENDENT_AMBULATORY_CARE_PROVIDER_SITE_OTHER): Payer: Medicare Other

## 2018-12-09 ENCOUNTER — Encounter: Payer: Self-pay | Admitting: Podiatry

## 2018-12-09 ENCOUNTER — Other Ambulatory Visit: Payer: Self-pay | Admitting: Podiatry

## 2018-12-09 ENCOUNTER — Other Ambulatory Visit: Payer: Self-pay

## 2018-12-09 VITALS — Temp 97.6°F

## 2018-12-09 DIAGNOSIS — S92012D Displaced fracture of body of left calcaneus, subsequent encounter for fracture with routine healing: Secondary | ICD-10-CM

## 2018-12-09 DIAGNOSIS — Z9889 Other specified postprocedural states: Secondary | ICD-10-CM

## 2018-12-10 DIAGNOSIS — E559 Vitamin D deficiency, unspecified: Secondary | ICD-10-CM | POA: Diagnosis not present

## 2018-12-10 DIAGNOSIS — G6289 Other specified polyneuropathies: Secondary | ICD-10-CM | POA: Diagnosis not present

## 2018-12-10 DIAGNOSIS — L89626 Pressure-induced deep tissue damage of left heel: Secondary | ICD-10-CM | POA: Diagnosis not present

## 2018-12-10 DIAGNOSIS — S92012D Displaced fracture of body of left calcaneus, subsequent encounter for fracture with routine healing: Secondary | ICD-10-CM | POA: Diagnosis not present

## 2018-12-10 DIAGNOSIS — I10 Essential (primary) hypertension: Secondary | ICD-10-CM | POA: Diagnosis not present

## 2018-12-10 DIAGNOSIS — M810 Age-related osteoporosis without current pathological fracture: Secondary | ICD-10-CM | POA: Diagnosis not present

## 2018-12-11 DIAGNOSIS — E559 Vitamin D deficiency, unspecified: Secondary | ICD-10-CM | POA: Diagnosis not present

## 2018-12-11 DIAGNOSIS — I10 Essential (primary) hypertension: Secondary | ICD-10-CM | POA: Diagnosis not present

## 2018-12-11 DIAGNOSIS — S92012D Displaced fracture of body of left calcaneus, subsequent encounter for fracture with routine healing: Secondary | ICD-10-CM | POA: Diagnosis not present

## 2018-12-11 DIAGNOSIS — L89626 Pressure-induced deep tissue damage of left heel: Secondary | ICD-10-CM | POA: Diagnosis not present

## 2018-12-11 DIAGNOSIS — M810 Age-related osteoporosis without current pathological fracture: Secondary | ICD-10-CM | POA: Diagnosis not present

## 2018-12-11 DIAGNOSIS — G6289 Other specified polyneuropathies: Secondary | ICD-10-CM | POA: Diagnosis not present

## 2018-12-16 ENCOUNTER — Ambulatory Visit (INDEPENDENT_AMBULATORY_CARE_PROVIDER_SITE_OTHER): Payer: Medicare Other | Admitting: Podiatry

## 2018-12-16 ENCOUNTER — Other Ambulatory Visit: Payer: Self-pay

## 2018-12-16 ENCOUNTER — Encounter: Payer: Self-pay | Admitting: Podiatry

## 2018-12-16 VITALS — Temp 96.0°F | Resp 16

## 2018-12-16 DIAGNOSIS — S92012A Displaced fracture of body of left calcaneus, initial encounter for closed fracture: Secondary | ICD-10-CM

## 2018-12-16 DIAGNOSIS — Z9889 Other specified postprocedural states: Secondary | ICD-10-CM

## 2018-12-16 NOTE — Progress Notes (Signed)
Subjective:  Patient ID: Jay Warren, male    DOB: July 12, 1933,  MRN: [id number]  Chief Complaint  Patient presents with  . Routine Post Op    POV#3 DOS 11/26/2018 ORIF CALCANEUS LT -Pt. states," it's been doing good, pain right now would be 1/2 but when I step on it maybe a 5/10." Tx: boot, and elevation -pt denies N/V/F/Ch     DOS: 11/26/2018 Procedure: Open reduction percutaneous pinning of left calcaneus fracture  85 y.o. male returns for post-op check.  History as above. States he is taking one step or two max to assist in transferring. Otherwise no pain at rest.  Review of Systems: Negative except as noted in the HPI. Denies N/V/F/Ch.  Past Medical History:  Diagnosis Date  . BPH (benign prostatic hyperplasia)   . Brain cancer ([place])   . Hematuria   . Hyperlipidemia   . Hypertension   . Melkersson-Rosenthal syndrome   . MGUS (monoclonal gammopathy of unknown significance)   . Non-small cell lung cancer ([place])   . Osteoporosis   . Peripheral neuropathy   . Rosacea   . Seizure ([place])   . Vitamin D deficiency     Current Outpatient Medications:  .  alendronate (FOSAMAX) 70 MG tablet, Take 1 tablet (70 mg total) by mouth once a week. Take with a full glass of water on an empty stomach., Disp: 4 tablet, Rfl: 0 .  atorvastatin (LIPITOR) 40 MG tablet, , Disp: , Rfl:  .  bisoprolol-hydrochlorothiazide (ZIAC) 10-6.25 MG tablet, Take 1 tablet by mouth daily., Disp: 30 tablet, Rfl: 0 .  calcium carbonate (OS-CAL) 600 MG tablet, Take 2 tablets (1,200 mg total) by mouth daily., Disp: 60 tablet, Rfl: 0 .  cephALEXin (KEFLEX) 500 MG capsule, Take 1 capsule (500 mg total) by mouth 2 (two) times daily., Disp: 14 capsule, Rfl: 0 .  cephALEXin (KEFLEX) 500 MG capsule, , Disp: , Rfl:  .  Cholecalciferol (VITAMIN D3 MAXIMUM STRENGTH) 5000 units capsule, Take 1 capsule (5,000 Units total) by mouth daily., Disp: 30 capsule, Rfl: 0 .  furosemide (LASIX) 40 MG tablet, , Disp: , Rfl:  .   gabapentin (NEURONTIN) 100 MG capsule, , Disp: , Rfl:  .  HYDROcodone-acetaminophen (NORCO) 5-325 MG tablet, Take 1 tablet by mouth every 6 (six) hours as needed for moderate pain., Disp: 12 tablet, Rfl: 0 .  HYDROcodone-acetaminophen (NORCO/VICODIN) 5-325 MG tablet, , Disp: , Rfl:  .  losartan (COZAAR) 50 MG tablet, Take 1 tablet (50 mg total) by mouth daily., Disp: 30 tablet, Rfl: 0 .  Multiple Vitamins-Minerals (MULTIVITAMIN) tablet, Take 1 tablet by mouth daily., Disp: 30 tablet, Rfl: 0 .  oxyCODONE-acetaminophen (PERCOCET) 10-325 MG tablet, , Disp: , Rfl:  .  oxyCODONE-acetaminophen (PERCOCET) 5-325 MG tablet, Take 1 tablet by mouth every 4 (four) hours as needed for severe pain., Disp: 20 tablet, Rfl: 0 .  phenytoin (DILANTIN) 100 MG ER capsule, Take 1 capsule (100 mg total) by mouth 3 (three) times daily., Disp: 90 capsule, Rfl: 0 .  phenytoin (DILANTIN) 100 MG ER capsule, , Disp: , Rfl:  .  simvastatin (ZOCOR) 80 MG tablet, Take 1 tablet (80 mg total) by mouth at bedtime., Disp: 30 tablet, Rfl: 0 .  tamsulosin (FLOMAX) 0.4 MG CAPS capsule, , Disp: , Rfl:   Social History   Tobacco Use  Smoking Status Former Smoker  . Packs/day: 2.00  . Years: 30.00  . Pack years: 60.00  . Types: Cigarettes  . Quit date: 06/04/1994  .  Years since quitting: 24.5  Smokeless Tobacco Never Used    No Known Allergies Objective:   Vitals:   12/16/18 0941  Resp: 16  Temp: (!) 96 F (35.6 C)   There is no height or weight on file to calculate BMI. Constitutional Well developed. Well nourished.  Vascular Foot warm and well perfused. Capillary refill normal to all digits.   Neurologic Normal speech. Oriented to person, place, and time. Epicritic sensation to light touch grossly present bilaterally.  Dermatologic Skin healing well without signs of infection. All incisions healed with remaining suture material ready for removal. Pin sites without signs of infection. Plantar heel eschar remains but  underneath with viable skin. Left intact.  Orthopedic: No tenderness to palpation noted about the surgical site. No pain with inversion/eversion of the heel. Pins intact   Radiographs: None today. Assessment:   1. Post-operative state   2. Closed displaced fracture of body of left calcaneus, initial encounter    Plan:  Patient was evaluated and treated and all questions answered.  S/p foot surgery left -Progressing as expected post-operatively. -XR: None today. -WB Status: NWB in CAM boot with wheelchair -Sutures: remaining sutures removed. -Medications: none -Foot redressed.  Heel Wound left -Discussed leaving tissue intact for skin to slough off naturally. There does appear to be healed skin underneath but will not disturb. No palpable continued bony preminence felt contributing to pressure.  No follow-ups on file.

## 2018-12-17 DIAGNOSIS — L89626 Pressure-induced deep tissue damage of left heel: Secondary | ICD-10-CM | POA: Diagnosis not present

## 2018-12-17 DIAGNOSIS — G6289 Other specified polyneuropathies: Secondary | ICD-10-CM | POA: Diagnosis not present

## 2018-12-17 DIAGNOSIS — I10 Essential (primary) hypertension: Secondary | ICD-10-CM | POA: Diagnosis not present

## 2018-12-17 DIAGNOSIS — M810 Age-related osteoporosis without current pathological fracture: Secondary | ICD-10-CM | POA: Diagnosis not present

## 2018-12-17 DIAGNOSIS — E559 Vitamin D deficiency, unspecified: Secondary | ICD-10-CM | POA: Diagnosis not present

## 2018-12-17 DIAGNOSIS — S92012D Displaced fracture of body of left calcaneus, subsequent encounter for fracture with routine healing: Secondary | ICD-10-CM | POA: Diagnosis not present

## 2018-12-18 DIAGNOSIS — S92012D Displaced fracture of body of left calcaneus, subsequent encounter for fracture with routine healing: Secondary | ICD-10-CM | POA: Diagnosis not present

## 2018-12-18 DIAGNOSIS — E559 Vitamin D deficiency, unspecified: Secondary | ICD-10-CM | POA: Diagnosis not present

## 2018-12-18 DIAGNOSIS — G6289 Other specified polyneuropathies: Secondary | ICD-10-CM | POA: Diagnosis not present

## 2018-12-18 DIAGNOSIS — M810 Age-related osteoporosis without current pathological fracture: Secondary | ICD-10-CM | POA: Diagnosis not present

## 2018-12-18 DIAGNOSIS — I10 Essential (primary) hypertension: Secondary | ICD-10-CM | POA: Diagnosis not present

## 2018-12-18 DIAGNOSIS — L89626 Pressure-induced deep tissue damage of left heel: Secondary | ICD-10-CM | POA: Diagnosis not present

## 2018-12-24 DIAGNOSIS — M810 Age-related osteoporosis without current pathological fracture: Secondary | ICD-10-CM | POA: Diagnosis not present

## 2018-12-24 DIAGNOSIS — S92012D Displaced fracture of body of left calcaneus, subsequent encounter for fracture with routine healing: Secondary | ICD-10-CM | POA: Diagnosis not present

## 2018-12-24 DIAGNOSIS — L89626 Pressure-induced deep tissue damage of left heel: Secondary | ICD-10-CM | POA: Diagnosis not present

## 2018-12-24 DIAGNOSIS — I10 Essential (primary) hypertension: Secondary | ICD-10-CM | POA: Diagnosis not present

## 2018-12-24 DIAGNOSIS — E559 Vitamin D deficiency, unspecified: Secondary | ICD-10-CM | POA: Diagnosis not present

## 2018-12-24 DIAGNOSIS — G6289 Other specified polyneuropathies: Secondary | ICD-10-CM | POA: Diagnosis not present

## 2018-12-25 DIAGNOSIS — E559 Vitamin D deficiency, unspecified: Secondary | ICD-10-CM | POA: Diagnosis not present

## 2018-12-25 DIAGNOSIS — L89626 Pressure-induced deep tissue damage of left heel: Secondary | ICD-10-CM | POA: Diagnosis not present

## 2018-12-25 DIAGNOSIS — S92012D Displaced fracture of body of left calcaneus, subsequent encounter for fracture with routine healing: Secondary | ICD-10-CM | POA: Diagnosis not present

## 2018-12-25 DIAGNOSIS — G6289 Other specified polyneuropathies: Secondary | ICD-10-CM | POA: Diagnosis not present

## 2018-12-25 DIAGNOSIS — M810 Age-related osteoporosis without current pathological fracture: Secondary | ICD-10-CM | POA: Diagnosis not present

## 2018-12-25 DIAGNOSIS — I10 Essential (primary) hypertension: Secondary | ICD-10-CM | POA: Diagnosis not present

## 2018-12-27 DIAGNOSIS — L89626 Pressure-induced deep tissue damage of left heel: Secondary | ICD-10-CM | POA: Diagnosis not present

## 2018-12-27 DIAGNOSIS — Z79899 Other long term (current) drug therapy: Secondary | ICD-10-CM | POA: Diagnosis not present

## 2018-12-27 DIAGNOSIS — Z9181 History of falling: Secondary | ICD-10-CM | POA: Diagnosis not present

## 2018-12-27 DIAGNOSIS — G6289 Other specified polyneuropathies: Secondary | ICD-10-CM | POA: Diagnosis not present

## 2018-12-27 DIAGNOSIS — I10 Essential (primary) hypertension: Secondary | ICD-10-CM | POA: Diagnosis not present

## 2018-12-27 DIAGNOSIS — M810 Age-related osteoporosis without current pathological fracture: Secondary | ICD-10-CM | POA: Diagnosis not present

## 2018-12-27 DIAGNOSIS — S92012D Displaced fracture of body of left calcaneus, subsequent encounter for fracture with routine healing: Secondary | ICD-10-CM | POA: Diagnosis not present

## 2018-12-27 DIAGNOSIS — Z85841 Personal history of malignant neoplasm of brain: Secondary | ICD-10-CM | POA: Diagnosis not present

## 2018-12-27 DIAGNOSIS — E559 Vitamin D deficiency, unspecified: Secondary | ICD-10-CM | POA: Diagnosis not present

## 2018-12-27 DIAGNOSIS — Z79891 Long term (current) use of opiate analgesic: Secondary | ICD-10-CM | POA: Diagnosis not present

## 2018-12-27 DIAGNOSIS — Z85118 Personal history of other malignant neoplasm of bronchus and lung: Secondary | ICD-10-CM | POA: Diagnosis not present

## 2018-12-28 NOTE — Progress Notes (Signed)
Subjective:  Patient ID: Jay Warren, male    DOB: April 26, 1934,  MRN: [id number]  Chief Complaint  Patient presents with  . Routine Post Op    swelling has come down and the purple color is not as bad as it was last time    DOS: 11/26/2018 Procedure: Open reduction percutaneous pinning of left calcaneus fracture  85 y.o. male returns for post-op check.  History as above.  Doing well states that the swelling is down does not have as much pain  Review of Systems: Negative except as noted in the HPI. Denies N/V/F/Ch.  Past Medical History:  Diagnosis Date  . BPH (benign prostatic hyperplasia)   . Brain cancer ([place])   . Hematuria   . Hyperlipidemia   . Hypertension   . Melkersson-Rosenthal syndrome   . MGUS (monoclonal gammopathy of unknown significance)   . Non-small cell lung cancer ([place])   . Osteoporosis   . Peripheral neuropathy   . Rosacea   . Seizure ([place])   . Vitamin D deficiency     Current Outpatient Medications:  .  alendronate (FOSAMAX) 70 MG tablet, Take 1 tablet (70 mg total) by mouth once a week. Take with a full glass of water on an empty stomach., Disp: 4 tablet, Rfl: 0 .  atorvastatin (LIPITOR) 40 MG tablet, , Disp: , Rfl:  .  bisoprolol-hydrochlorothiazide (ZIAC) 10-6.25 MG tablet, Take 1 tablet by mouth daily., Disp: 30 tablet, Rfl: 0 .  calcium carbonate (OS-CAL) 600 MG tablet, Take 2 tablets (1,200 mg total) by mouth daily., Disp: 60 tablet, Rfl: 0 .  cephALEXin (KEFLEX) 500 MG capsule, Take 1 capsule (500 mg total) by mouth 2 (two) times daily., Disp: 14 capsule, Rfl: 0 .  cephALEXin (KEFLEX) 500 MG capsule, , Disp: , Rfl:  .  Cholecalciferol (VITAMIN D3 MAXIMUM STRENGTH) 5000 units capsule, Take 1 capsule (5,000 Units total) by mouth daily., Disp: 30 capsule, Rfl: 0 .  furosemide (LASIX) 40 MG tablet, , Disp: , Rfl:  .  gabapentin (NEURONTIN) 100 MG capsule, , Disp: , Rfl:  .  HYDROcodone-acetaminophen (NORCO) 5-325 MG tablet, Take 1 tablet by mouth every  6 (six) hours as needed for moderate pain., Disp: 12 tablet, Rfl: 0 .  HYDROcodone-acetaminophen (NORCO/VICODIN) 5-325 MG tablet, , Disp: , Rfl:  .  losartan (COZAAR) 50 MG tablet, Take 1 tablet (50 mg total) by mouth daily., Disp: 30 tablet, Rfl: 0 .  Multiple Vitamins-Minerals (MULTIVITAMIN) tablet, Take 1 tablet by mouth daily., Disp: 30 tablet, Rfl: 0 .  oxyCODONE-acetaminophen (PERCOCET) 10-325 MG tablet, , Disp: , Rfl:  .  oxyCODONE-acetaminophen (PERCOCET) 5-325 MG tablet, Take 1 tablet by mouth every 4 (four) hours as needed for severe pain., Disp: 20 tablet, Rfl: 0 .  phenytoin (DILANTIN) 100 MG ER capsule, Take 1 capsule (100 mg total) by mouth 3 (three) times daily., Disp: 90 capsule, Rfl: 0 .  phenytoin (DILANTIN) 100 MG ER capsule, , Disp: , Rfl:  .  simvastatin (ZOCOR) 80 MG tablet, Take 1 tablet (80 mg total) by mouth at bedtime., Disp: 30 tablet, Rfl: 0 .  tamsulosin (FLOMAX) 0.4 MG CAPS capsule, , Disp: , Rfl:   Social History   Tobacco Use  Smoking Status Former Smoker  . Packs/day: 2.00  . Years: 30.00  . Pack years: 60.00  . Types: Cigarettes  . Quit date: 06/04/1994  . Years since quitting: 24.5  Smokeless Tobacco Never Used    No Known Allergies Objective:   Vitals:  12/09/18 0825  Temp: 97.6 F (36.4 C)   There is no height or weight on file to calculate BMI. Constitutional Well developed. Well nourished.  Vascular Foot warm and well perfused. Capillary refill normal to all digits.   Neurologic Normal speech. Oriented to person, place, and time. Epicritic sensation to light touch grossly present bilaterally.  Dermatologic  skin healing well no continued posterior heel necrosis appears to have good skin underneath the area of prior ecchymosis  Orthopedic:  No tenderness palpation of the surgical sites no pain with calcaneal inversion eversion   Radiographs: Taken and reviewed no interval change posterior to anterior pins intact Assessment:   1.  Post-operative state    Plan:  Patient was evaluated and treated and all questions answered.  S/p foot surgery left -Progressing as expected post-operatively. -XR: As above -WB Status: NWB in CAM boot with wheelchair -Sutures: intact -Swelling reduced -Medications: none -Foot redressed.  No follow-ups on file.

## 2018-12-29 DIAGNOSIS — G6289 Other specified polyneuropathies: Secondary | ICD-10-CM | POA: Diagnosis not present

## 2018-12-29 DIAGNOSIS — M810 Age-related osteoporosis without current pathological fracture: Secondary | ICD-10-CM | POA: Diagnosis not present

## 2018-12-29 DIAGNOSIS — E559 Vitamin D deficiency, unspecified: Secondary | ICD-10-CM | POA: Diagnosis not present

## 2018-12-29 DIAGNOSIS — S92012D Displaced fracture of body of left calcaneus, subsequent encounter for fracture with routine healing: Secondary | ICD-10-CM | POA: Diagnosis not present

## 2018-12-29 DIAGNOSIS — I10 Essential (primary) hypertension: Secondary | ICD-10-CM | POA: Diagnosis not present

## 2018-12-29 DIAGNOSIS — L89626 Pressure-induced deep tissue damage of left heel: Secondary | ICD-10-CM | POA: Diagnosis not present

## 2018-12-30 ENCOUNTER — Other Ambulatory Visit: Payer: Self-pay

## 2018-12-30 ENCOUNTER — Ambulatory Visit (INDEPENDENT_AMBULATORY_CARE_PROVIDER_SITE_OTHER): Payer: Medicare Other

## 2018-12-30 ENCOUNTER — Encounter: Payer: Self-pay | Admitting: Podiatry

## 2018-12-30 ENCOUNTER — Ambulatory Visit (INDEPENDENT_AMBULATORY_CARE_PROVIDER_SITE_OTHER): Payer: Medicare Other | Admitting: Podiatry

## 2018-12-30 VITALS — Temp 96.2°F | Resp 16

## 2018-12-30 DIAGNOSIS — L89626 Pressure-induced deep tissue damage of left heel: Secondary | ICD-10-CM

## 2018-12-30 DIAGNOSIS — S92012A Displaced fracture of body of left calcaneus, initial encounter for closed fracture: Secondary | ICD-10-CM | POA: Diagnosis not present

## 2018-12-30 DIAGNOSIS — L97421 Non-pressure chronic ulcer of left heel and midfoot limited to breakdown of skin: Secondary | ICD-10-CM

## 2018-12-30 DIAGNOSIS — Z9889 Other specified postprocedural states: Secondary | ICD-10-CM

## 2018-12-30 MED ORDER — CEPHALEXIN 500 MG PO CAPS: 500.0000 mg | ORAL_CAPSULE | Freq: Two times a day (BID) | ORAL | 0 refills | Status: DC

## 2018-12-30 NOTE — Progress Notes (Signed)
Subjective:  Patient ID: Jay Warren, male    DOB: 09-21-1933,  MRN: [id number]  Chief Complaint  Patient presents with  . Routine Post Op     DOS 11/26/2018 ORIF CALCANEUS LT- Pt. states," pain is okay, just when I step accidently I feel distressed."  -pt states he had last bandage application last Thurs. and it's still looking the same -Tx: boot, wheelchair and bandage at wound - w/ littel more draiange and redness     DOS: 11/26/2018 Procedure: Open reduction percutaneous pinning of left calcaneus fracture  85 y.o. male returns for post-op check.  History as above.   Review of Systems: Negative except as noted in the HPI. Denies N/V/F/Ch.  Past Medical History:  Diagnosis Date  . BPH (benign prostatic hyperplasia)   . Brain cancer ([place])   . Hematuria   . Hyperlipidemia   . Hypertension   . Melkersson-Rosenthal syndrome   . MGUS (monoclonal gammopathy of unknown significance)   . Non-small cell lung cancer ([place])   . Osteoporosis   . Peripheral neuropathy   . Rosacea   . Seizure ([place])   . Vitamin D deficiency     Current Outpatient Medications:  .  alendronate (FOSAMAX) 70 MG tablet, Take 1 tablet (70 mg total) by mouth once a week. Take with a full glass of water on an empty stomach., Disp: 4 tablet, Rfl: 0 .  atorvastatin (LIPITOR) 40 MG tablet, , Disp: , Rfl:  .  bisoprolol-hydrochlorothiazide (ZIAC) 10-6.25 MG tablet, Take 1 tablet by mouth daily., Disp: 30 tablet, Rfl: 0 .  calcium carbonate (OS-CAL) 600 MG tablet, Take 2 tablets (1,200 mg total) by mouth daily., Disp: 60 tablet, Rfl: 0 .  cephALEXin (KEFLEX) 500 MG capsule, Take 1 capsule (500 mg total) by mouth 2 (two) times daily., Disp: 14 capsule, Rfl: 0 .  Cholecalciferol (VITAMIN D3 MAXIMUM STRENGTH) 5000 units capsule, Take 1 capsule (5,000 Units total) by mouth daily., Disp: 30 capsule, Rfl: 0 .  furosemide (LASIX) 40 MG tablet, , Disp: , Rfl:  .  gabapentin (NEURONTIN) 100 MG capsule, , Disp: , Rfl:  .   HYDROcodone-acetaminophen (NORCO) 5-325 MG tablet, Take 1 tablet by mouth every 6 (six) hours as needed for moderate pain., Disp: 12 tablet, Rfl: 0 .  HYDROcodone-acetaminophen (NORCO/VICODIN) 5-325 MG tablet, , Disp: , Rfl:  .  losartan (COZAAR) 50 MG tablet, Take 1 tablet (50 mg total) by mouth daily., Disp: 30 tablet, Rfl: 0 .  Multiple Vitamins-Minerals (MULTIVITAMIN) tablet, Take 1 tablet by mouth daily., Disp: 30 tablet, Rfl: 0 .  oxyCODONE-acetaminophen (PERCOCET) 10-325 MG tablet, , Disp: , Rfl:  .  oxyCODONE-acetaminophen (PERCOCET) 5-325 MG tablet, Take 1 tablet by mouth every 4 (four) hours as needed for severe pain., Disp: 20 tablet, Rfl: 0 .  phenytoin (DILANTIN) 100 MG ER capsule, Take 1 capsule (100 mg total) by mouth 3 (three) times daily., Disp: 90 capsule, Rfl: 0 .  phenytoin (DILANTIN) 100 MG ER capsule, , Disp: , Rfl:  .  simvastatin (ZOCOR) 80 MG tablet, Take 1 tablet (80 mg total) by mouth at bedtime., Disp: 30 tablet, Rfl: 0 .  tamsulosin (FLOMAX) 0.4 MG CAPS capsule, , Disp: , Rfl:   Social History   Tobacco Use  Smoking Status Former Smoker  . Packs/day: 2.00  . Years: 30.00  . Pack years: 60.00  . Types: Cigarettes  . Quit date: 06/04/1994  . Years since quitting: 24.5  Smokeless Tobacco Never Used  No Known Allergies Objective:   Vitals:   12/30/18 0952  Resp: 16  Temp: (!) 96.2 F (35.7 C)   There is no height or weight on file to calculate BMI. Constitutional Well developed. Well nourished.  Vascular Foot warm and well perfused. Capillary refill normal to all digits.   Neurologic Normal speech. Oriented to person, place, and time. Epicritic sensation to light touch grossly present bilaterally.  Dermatologic Skin healing well without signs of infection. All incisions healed with remaining suture material ready for removal. Pin sites with slight periwound redness. Plantar heel eschar resolved now with 3 small 0.5cm diameter ulcerations superficial  0.5cm x0.5 cm with fibrous base periwound redness. No probe to bone. No ascending cellulitils  Orthopedic: No tenderness to palpation noted about the surgical site. No pain with inversion/eversion of the heel. Pins intact   Radiographs: Taken and reviewed no interval changes pins intact left lower extremity Assessment:   1. Post-operative state   2. Closed displaced fracture of body of left calcaneus, initial encounter   3. Pressure injury of deep tissue of left heel   4. Ulcer of heel, left, limited to breakdown of skin [facility])    Plan:  Patient was evaluated and treated and all questions answered.  S/p foot surgery left -Progressing as expected post-operatively. -XR: taken and reviewed as above. -WB Status: NWB in CAM boot with wheelchair -Sutures: remaining sutures removed. -Medications: none -Foot redressed.  Heel Wound left -Appears to have mostly sloughed off with only 3 small superficial 0.5cm diameter fibrous ulcerations. -Rx santyl for above -Updated [facility]HC orders to apply 3x weekly  Return in about 2 weeks (around 01/13/2019).

## 2019-01-01 ENCOUNTER — Telehealth: Payer: Self-pay | Admitting: *Deleted

## 2019-01-01 DIAGNOSIS — M810 Age-related osteoporosis without current pathological fracture: Secondary | ICD-10-CM | POA: Diagnosis not present

## 2019-01-01 DIAGNOSIS — E559 Vitamin D deficiency, unspecified: Secondary | ICD-10-CM | POA: Diagnosis not present

## 2019-01-01 DIAGNOSIS — L89626 Pressure-induced deep tissue damage of left heel: Secondary | ICD-10-CM | POA: Diagnosis not present

## 2019-01-01 DIAGNOSIS — S92012D Displaced fracture of body of left calcaneus, subsequent encounter for fracture with routine healing: Secondary | ICD-10-CM | POA: Diagnosis not present

## 2019-01-01 DIAGNOSIS — I10 Essential (primary) hypertension: Secondary | ICD-10-CM | POA: Diagnosis not present

## 2019-01-01 DIAGNOSIS — G6289 Other specified polyneuropathies: Secondary | ICD-10-CM | POA: Diagnosis not present

## 2019-01-01 MED ORDER — SANTYL 250 UNIT/GM EX OINT: 1.0000 "application " | TOPICAL_OINTMENT | Freq: Every day | CUTANEOUS | 5 refills | Status: AC

## 2019-01-01 NOTE — Telephone Encounter (Signed)
I called Christa, PT - [facility] and informed of Dr. March Rummage orders to continue non-weight bearing.

## 2019-01-01 NOTE — Telephone Encounter (Signed)
Approved, patient is still non-WB

## 2019-01-01 NOTE — Telephone Encounter (Signed)
[place], PT states pt is non-weight bearing and calling to confirm continuation of non-weight bearing, change PT to once a week for the remaining visits.

## 2019-01-01 NOTE — Telephone Encounter (Signed)
Salome states pt was seen in office 12/30/2018 and Santyl needs to be called to [facility].

## 2019-01-01 NOTE — Telephone Encounter (Signed)
-----   Message from Evelina Bucy, DPM sent at 12/30/2018 10:27 AM EDT ----- Updated orders to [facility] - for santyl WTD thrice weekly.Order Santyl Wounds to left heel - 0.5 x 0.5. Two separate wounds to the heel each that measurement.

## 2019-01-01 NOTE — Telephone Encounter (Signed)
Faxed a copy of Dr. Eleanora Neighbor 12/30/2018 10:27am orders to [facility].

## 2019-01-02 DIAGNOSIS — E559 Vitamin D deficiency, unspecified: Secondary | ICD-10-CM | POA: Diagnosis not present

## 2019-01-02 DIAGNOSIS — M810 Age-related osteoporosis without current pathological fracture: Secondary | ICD-10-CM | POA: Diagnosis not present

## 2019-01-02 DIAGNOSIS — G6289 Other specified polyneuropathies: Secondary | ICD-10-CM | POA: Diagnosis not present

## 2019-01-02 DIAGNOSIS — I10 Essential (primary) hypertension: Secondary | ICD-10-CM | POA: Diagnosis not present

## 2019-01-02 DIAGNOSIS — L89626 Pressure-induced deep tissue damage of left heel: Secondary | ICD-10-CM | POA: Diagnosis not present

## 2019-01-02 DIAGNOSIS — S92012D Displaced fracture of body of left calcaneus, subsequent encounter for fracture with routine healing: Secondary | ICD-10-CM | POA: Diagnosis not present

## 2019-01-05 DIAGNOSIS — I10 Essential (primary) hypertension: Secondary | ICD-10-CM | POA: Diagnosis not present

## 2019-01-05 DIAGNOSIS — G6289 Other specified polyneuropathies: Secondary | ICD-10-CM | POA: Diagnosis not present

## 2019-01-05 DIAGNOSIS — L89626 Pressure-induced deep tissue damage of left heel: Secondary | ICD-10-CM | POA: Diagnosis not present

## 2019-01-05 DIAGNOSIS — E559 Vitamin D deficiency, unspecified: Secondary | ICD-10-CM | POA: Diagnosis not present

## 2019-01-05 DIAGNOSIS — S92012D Displaced fracture of body of left calcaneus, subsequent encounter for fracture with routine healing: Secondary | ICD-10-CM | POA: Diagnosis not present

## 2019-01-05 DIAGNOSIS — M810 Age-related osteoporosis without current pathological fracture: Secondary | ICD-10-CM | POA: Diagnosis not present

## 2019-01-07 DIAGNOSIS — I10 Essential (primary) hypertension: Secondary | ICD-10-CM | POA: Diagnosis not present

## 2019-01-07 DIAGNOSIS — L89626 Pressure-induced deep tissue damage of left heel: Secondary | ICD-10-CM | POA: Diagnosis not present

## 2019-01-07 DIAGNOSIS — M810 Age-related osteoporosis without current pathological fracture: Secondary | ICD-10-CM | POA: Diagnosis not present

## 2019-01-07 DIAGNOSIS — S92012D Displaced fracture of body of left calcaneus, subsequent encounter for fracture with routine healing: Secondary | ICD-10-CM | POA: Diagnosis not present

## 2019-01-07 DIAGNOSIS — G6289 Other specified polyneuropathies: Secondary | ICD-10-CM | POA: Diagnosis not present

## 2019-01-07 DIAGNOSIS — E559 Vitamin D deficiency, unspecified: Secondary | ICD-10-CM | POA: Diagnosis not present

## 2019-01-09 DIAGNOSIS — L89626 Pressure-induced deep tissue damage of left heel: Secondary | ICD-10-CM | POA: Diagnosis not present

## 2019-01-09 DIAGNOSIS — G6289 Other specified polyneuropathies: Secondary | ICD-10-CM | POA: Diagnosis not present

## 2019-01-09 DIAGNOSIS — E559 Vitamin D deficiency, unspecified: Secondary | ICD-10-CM | POA: Diagnosis not present

## 2019-01-09 DIAGNOSIS — I10 Essential (primary) hypertension: Secondary | ICD-10-CM | POA: Diagnosis not present

## 2019-01-09 DIAGNOSIS — S92012D Displaced fracture of body of left calcaneus, subsequent encounter for fracture with routine healing: Secondary | ICD-10-CM | POA: Diagnosis not present

## 2019-01-09 DIAGNOSIS — M810 Age-related osteoporosis without current pathological fracture: Secondary | ICD-10-CM | POA: Diagnosis not present

## 2019-01-12 ENCOUNTER — Telehealth: Payer: Self-pay | Admitting: *Deleted

## 2019-01-12 DIAGNOSIS — S92012D Displaced fracture of body of left calcaneus, subsequent encounter for fracture with routine healing: Secondary | ICD-10-CM | POA: Diagnosis not present

## 2019-01-12 DIAGNOSIS — I10 Essential (primary) hypertension: Secondary | ICD-10-CM | POA: Diagnosis not present

## 2019-01-12 DIAGNOSIS — E559 Vitamin D deficiency, unspecified: Secondary | ICD-10-CM | POA: Diagnosis not present

## 2019-01-12 DIAGNOSIS — G6289 Other specified polyneuropathies: Secondary | ICD-10-CM | POA: Diagnosis not present

## 2019-01-12 DIAGNOSIS — G40909 Epilepsy, unspecified, not intractable, without status epilepticus: Secondary | ICD-10-CM | POA: Diagnosis not present

## 2019-01-12 DIAGNOSIS — Z9181 History of falling: Secondary | ICD-10-CM | POA: Diagnosis not present

## 2019-01-12 DIAGNOSIS — E785 Hyperlipidemia, unspecified: Secondary | ICD-10-CM | POA: Diagnosis not present

## 2019-01-12 DIAGNOSIS — C3491 Malignant neoplasm of unspecified part of right bronchus or lung: Secondary | ICD-10-CM | POA: Diagnosis not present

## 2019-01-12 DIAGNOSIS — M810 Age-related osteoporosis without current pathological fracture: Secondary | ICD-10-CM | POA: Diagnosis not present

## 2019-01-12 DIAGNOSIS — L89626 Pressure-induced deep tissue damage of left heel: Secondary | ICD-10-CM | POA: Diagnosis not present

## 2019-01-12 DIAGNOSIS — Z79899 Other long term (current) drug therapy: Secondary | ICD-10-CM | POA: Diagnosis not present

## 2019-01-12 DIAGNOSIS — Z139 Encounter for screening, unspecified: Secondary | ICD-10-CM | POA: Diagnosis not present

## 2019-01-12 NOTE — Telephone Encounter (Signed)
Noted thanks °

## 2019-01-12 NOTE — Telephone Encounter (Signed)
[facility] - Alician states pt has more redness to the left heel, and the pins appear to be digging the skin causing an open area, she wanted Dr. March Rummage to be aware for appt 01/13/2019.

## 2019-01-13 ENCOUNTER — Other Ambulatory Visit: Payer: Self-pay

## 2019-01-13 ENCOUNTER — Other Ambulatory Visit: Payer: Self-pay | Admitting: Podiatry

## 2019-01-13 ENCOUNTER — Encounter: Payer: Self-pay | Admitting: Podiatry

## 2019-01-13 ENCOUNTER — Ambulatory Visit (INDEPENDENT_AMBULATORY_CARE_PROVIDER_SITE_OTHER): Payer: Medicare Other | Admitting: Podiatry

## 2019-01-13 ENCOUNTER — Ambulatory Visit (INDEPENDENT_AMBULATORY_CARE_PROVIDER_SITE_OTHER): Payer: Medicare Other

## 2019-01-13 VITALS — Temp 97.7°F | Resp 16

## 2019-01-13 DIAGNOSIS — L97421 Non-pressure chronic ulcer of left heel and midfoot limited to breakdown of skin: Secondary | ICD-10-CM

## 2019-01-13 DIAGNOSIS — Z9889 Other specified postprocedural states: Secondary | ICD-10-CM

## 2019-01-13 MED ORDER — AMOXICILLIN-POT CLAVULANATE 875-125 MG PO TABS: 1.0000 | ORAL_TABLET | Freq: Two times a day (BID) | ORAL | 0 refills | Status: DC

## 2019-01-13 NOTE — Addendum Note (Signed)
Addended by: Allean Found on: 01/13/2019 02:35 PM   Modules accepted: Orders

## 2019-01-13 NOTE — Progress Notes (Signed)
Subjective:  Patient ID: Jay Warren, male    DOB: 08/17/1933,  MRN: [id number]  Chief Complaint  Patient presents with  . Routine Post Op    PT. states," I feel good. Where the pins go in or the green bead when someone touches it there's pain." Tx: boot -pt states nuse says green beads going into the skin     DOS: 11/26/2018 Procedure: Open reduction percutaneous pinning of left calcaneus fracture  85 y.o. male returns for post-op check.  History as above.   Review of Systems: Negative except as noted in the HPI. Denies N/V/F/Ch.  Past Medical History:  Diagnosis Date  . BPH (benign prostatic hyperplasia)   . Brain cancer ([place])   . Hematuria   . Hyperlipidemia   . Hypertension   . Melkersson-Rosenthal syndrome   . MGUS (monoclonal gammopathy of unknown significance)   . Non-small cell lung cancer ([place])   . Osteoporosis   . Peripheral neuropathy   . Rosacea   . Seizure ([place])   . Vitamin D deficiency     Current Outpatient Medications:  .  alendronate (FOSAMAX) 70 MG tablet, Take 1 tablet (70 mg total) by mouth once a week. Take with a full glass of water on an empty stomach., Disp: 4 tablet, Rfl: 0 .  amoxicillin-clavulanate (AUGMENTIN) 875-125 MG tablet, Take 1 tablet by mouth 2 (two) times daily., Disp: 14 tablet, Rfl: 0 .  atorvastatin (LIPITOR) 40 MG tablet, , Disp: , Rfl:  .  bisoprolol-hydrochlorothiazide (ZIAC) 10-6.25 MG tablet, Take 1 tablet by mouth daily., Disp: 30 tablet, Rfl: 0 .  calcium carbonate (OS-CAL) 600 MG tablet, Take 2 tablets (1,200 mg total) by mouth daily., Disp: 60 tablet, Rfl: 0 .  cephALEXin (KEFLEX) 500 MG capsule, Take 1 capsule (500 mg total) by mouth 2 (two) times daily., Disp: 14 capsule, Rfl: 0 .  Cholecalciferol (VITAMIN D3 MAXIMUM STRENGTH) 5000 units capsule, Take 1 capsule (5,000 Units total) by mouth daily., Disp: 30 capsule, Rfl: 0 .  collagenase (SANTYL) ointment, Apply 1 application topically daily. Left foot wound measurements: 0.5  x 0.5 x 0.1cm, 0.5 x 0.5 x 0.1cm, and 0.5 x 0.5 x 0.1cm., Disp: 30 g, Rfl: 5 .  furosemide (LASIX) 40 MG tablet, , Disp: , Rfl:  .  gabapentin (NEURONTIN) 100 MG capsule, , Disp: , Rfl:  .  HYDROcodone-acetaminophen (NORCO) 5-325 MG tablet, Take 1 tablet by mouth every 6 (six) hours as needed for moderate pain., Disp: 12 tablet, Rfl: 0 .  HYDROcodone-acetaminophen (NORCO/VICODIN) 5-325 MG tablet, , Disp: , Rfl:  .  losartan (COZAAR) 50 MG tablet, Take 1 tablet (50 mg total) by mouth daily., Disp: 30 tablet, Rfl: 0 .  Multiple Vitamins-Minerals (MULTIVITAMIN) tablet, Take 1 tablet by mouth daily., Disp: 30 tablet, Rfl: 0 .  oxyCODONE-acetaminophen (PERCOCET) 10-325 MG tablet, , Disp: , Rfl:  .  oxyCODONE-acetaminophen (PERCOCET) 5-325 MG tablet, Take 1 tablet by mouth every 4 (four) hours as needed for severe pain., Disp: 20 tablet, Rfl: 0 .  phenytoin (DILANTIN) 100 MG ER capsule, Take 1 capsule (100 mg total) by mouth 3 (three) times daily., Disp: 90 capsule, Rfl: 0 .  phenytoin (DILANTIN) 100 MG ER capsule, , Disp: , Rfl:  .  simvastatin (ZOCOR) 80 MG tablet, Take 1 tablet (80 mg total) by mouth at bedtime., Disp: 30 tablet, Rfl: 0 .  tamsulosin (FLOMAX) 0.4 MG CAPS capsule, , Disp: , Rfl:   Social History   Tobacco Use  Smoking  Status Former Smoker  . Packs/day: 2.00  . Years: 30.00  . Pack years: 60.00  . Types: Cigarettes  . Quit date: 06/04/1994  . Years since quitting: 24.6  Smokeless Tobacco Never Used    No Known Allergies Objective:   Vitals:   01/13/19 0942  Resp: 16  Temp: 97.7 F (36.5 C)   There is no height or weight on file to calculate BMI. Constitutional Well developed. Well nourished.  Vascular Foot warm and well perfused. Capillary refill normal to all digits.   Neurologic Normal speech. Oriented to person, place, and time. Epicritic sensation to light touch grossly present bilaterally.  Dermatologic Pins digging into skin with periwound cellulitis.   Plantar heel eschar resolved now with small posteriormedial superficial ulcer measuring 0.5cm in diameter.  Orthopedic: Mild tenderness to palpation noted about the surgical site. No pain with inversion/eversion of the heel. Pins intact   Radiographs: Taken and reviewed pins intact fracture appears healed but fracture displacement noted. Assessment:   1. Ulcer of heel, left, limited to breakdown of skin [facility])    Plan:  Patient was evaluated and treated and all questions answered.  S/p foot surgery left -XR taken fracture healing evident though positioning is different compared to immediate post-ops. -Pins pulled as they were digging into skin -Periwound erythema/cellulitis noted. Rx Augmentin. Advised should this worsen or not improve to call promptly or present to ED. -Wound culture taken at pin sites. -Betadine WTD dressing applied.  Heel Wound left -Only one small 0.5cm fibrous ulcer remains. Continue santyl WTD  Return in about 1 week (around 01/20/2019).

## 2019-01-14 DIAGNOSIS — M810 Age-related osteoporosis without current pathological fracture: Secondary | ICD-10-CM | POA: Diagnosis not present

## 2019-01-14 DIAGNOSIS — S92012D Displaced fracture of body of left calcaneus, subsequent encounter for fracture with routine healing: Secondary | ICD-10-CM | POA: Diagnosis not present

## 2019-01-14 DIAGNOSIS — I10 Essential (primary) hypertension: Secondary | ICD-10-CM | POA: Diagnosis not present

## 2019-01-14 DIAGNOSIS — E559 Vitamin D deficiency, unspecified: Secondary | ICD-10-CM | POA: Diagnosis not present

## 2019-01-14 DIAGNOSIS — L89626 Pressure-induced deep tissue damage of left heel: Secondary | ICD-10-CM | POA: Diagnosis not present

## 2019-01-14 DIAGNOSIS — G6289 Other specified polyneuropathies: Secondary | ICD-10-CM | POA: Diagnosis not present

## 2019-01-15 ENCOUNTER — Telehealth: Payer: Self-pay | Admitting: *Deleted

## 2019-01-15 MED ORDER — CIPROFLOXACIN HCL 250 MG PO TABS: 250.0000 mg | ORAL_TABLET | Freq: Two times a day (BID) | ORAL | 0 refills | Status: DC

## 2019-01-15 NOTE — Telephone Encounter (Signed)
Unable to leave a message, voicemail stated no one is available to take the call, but there is no voice mailbox.

## 2019-01-15 NOTE — Telephone Encounter (Signed)
I called mobile phone listed for pt and pt's son, Richardson Landry answered. Richardson Landry states he takes pt to his appts and would be able to inform of orders. I informed Richardson Landry pt should stop the Augmentin and begin Cipro, I would send the orders to [place]. Orders sent to [place].

## 2019-01-16 ENCOUNTER — Telehealth: Payer: Self-pay | Admitting: *Deleted

## 2019-01-16 DIAGNOSIS — I10 Essential (primary) hypertension: Secondary | ICD-10-CM | POA: Diagnosis not present

## 2019-01-16 DIAGNOSIS — M810 Age-related osteoporosis without current pathological fracture: Secondary | ICD-10-CM | POA: Diagnosis not present

## 2019-01-16 DIAGNOSIS — L89626 Pressure-induced deep tissue damage of left heel: Secondary | ICD-10-CM | POA: Diagnosis not present

## 2019-01-16 DIAGNOSIS — G6289 Other specified polyneuropathies: Secondary | ICD-10-CM | POA: Diagnosis not present

## 2019-01-16 DIAGNOSIS — S92012D Displaced fracture of body of left calcaneus, subsequent encounter for fracture with routine healing: Secondary | ICD-10-CM | POA: Diagnosis not present

## 2019-01-16 DIAGNOSIS — E559 Vitamin D deficiency, unspecified: Secondary | ICD-10-CM | POA: Diagnosis not present

## 2019-01-16 LAB — WOUND CULTURE: Organism ID, Bacteria: NONE SEEN

## 2019-01-16 MED ORDER — BAXDELA 450 MG PO TABS: 1.0000 | ORAL_TABLET | Freq: Two times a day (BID) | ORAL | 0 refills | Status: AC

## 2019-01-16 NOTE — Telephone Encounter (Signed)
Dr. March Rummage ordered Baxdela 450mg  one tablet bid #14 and referral to Infectious Disease. Left message for [employer] rep - J. Hall Busing to call with ordering process.

## 2019-01-16 NOTE — Telephone Encounter (Signed)
Left message informing pt of Dr. March Rummage referral to [facility] in [facility] to monitor the infection and medications and Dr. March Rummage had changed the antibiotic because the two antibiotics he was on did not cover the organisms grown. I gave the address and contact phone of [facility].

## 2019-01-16 NOTE — Telephone Encounter (Signed)
Pt called and I explained the referral to Infectious Disease [phone] and change of medication.

## 2019-01-16 NOTE — Telephone Encounter (Signed)
[employer] rep - J. Hall Busing states co-pays have been waived, not forms to be completed.

## 2019-01-16 NOTE — Telephone Encounter (Signed)
Faxed required form, clinicals and demographics to [facility].

## 2019-01-16 NOTE — Telephone Encounter (Signed)
Left message [place] - Records to fax 11/26/2018 surgery notes to [phone] for a referral.

## 2019-01-19 DIAGNOSIS — I10 Essential (primary) hypertension: Secondary | ICD-10-CM | POA: Diagnosis not present

## 2019-01-19 DIAGNOSIS — L89626 Pressure-induced deep tissue damage of left heel: Secondary | ICD-10-CM | POA: Diagnosis not present

## 2019-01-19 DIAGNOSIS — G6289 Other specified polyneuropathies: Secondary | ICD-10-CM | POA: Diagnosis not present

## 2019-01-19 DIAGNOSIS — S92012D Displaced fracture of body of left calcaneus, subsequent encounter for fracture with routine healing: Secondary | ICD-10-CM | POA: Diagnosis not present

## 2019-01-19 DIAGNOSIS — M810 Age-related osteoporosis without current pathological fracture: Secondary | ICD-10-CM | POA: Diagnosis not present

## 2019-01-19 DIAGNOSIS — E559 Vitamin D deficiency, unspecified: Secondary | ICD-10-CM | POA: Diagnosis not present

## 2019-01-20 ENCOUNTER — Other Ambulatory Visit: Payer: Self-pay | Admitting: Podiatry

## 2019-01-20 ENCOUNTER — Ambulatory Visit (INDEPENDENT_AMBULATORY_CARE_PROVIDER_SITE_OTHER): Payer: Medicare Other

## 2019-01-20 ENCOUNTER — Ambulatory Visit (INDEPENDENT_AMBULATORY_CARE_PROVIDER_SITE_OTHER): Payer: Medicare Other | Admitting: Podiatry

## 2019-01-20 ENCOUNTER — Other Ambulatory Visit: Payer: Self-pay

## 2019-01-20 DIAGNOSIS — Z9889 Other specified postprocedural states: Secondary | ICD-10-CM

## 2019-01-20 DIAGNOSIS — L97421 Non-pressure chronic ulcer of left heel and midfoot limited to breakdown of skin: Secondary | ICD-10-CM

## 2019-01-21 DIAGNOSIS — M810 Age-related osteoporosis without current pathological fracture: Secondary | ICD-10-CM | POA: Diagnosis not present

## 2019-01-21 DIAGNOSIS — S92012D Displaced fracture of body of left calcaneus, subsequent encounter for fracture with routine healing: Secondary | ICD-10-CM | POA: Diagnosis not present

## 2019-01-21 DIAGNOSIS — L89626 Pressure-induced deep tissue damage of left heel: Secondary | ICD-10-CM | POA: Diagnosis not present

## 2019-01-21 DIAGNOSIS — E559 Vitamin D deficiency, unspecified: Secondary | ICD-10-CM | POA: Diagnosis not present

## 2019-01-21 DIAGNOSIS — G6289 Other specified polyneuropathies: Secondary | ICD-10-CM | POA: Diagnosis not present

## 2019-01-21 DIAGNOSIS — I10 Essential (primary) hypertension: Secondary | ICD-10-CM | POA: Diagnosis not present

## 2019-01-23 ENCOUNTER — Telehealth: Payer: Self-pay | Admitting: *Deleted

## 2019-01-23 DIAGNOSIS — L89626 Pressure-induced deep tissue damage of left heel: Secondary | ICD-10-CM | POA: Diagnosis not present

## 2019-01-23 DIAGNOSIS — I10 Essential (primary) hypertension: Secondary | ICD-10-CM | POA: Diagnosis not present

## 2019-01-23 DIAGNOSIS — M810 Age-related osteoporosis without current pathological fracture: Secondary | ICD-10-CM | POA: Diagnosis not present

## 2019-01-23 DIAGNOSIS — G6289 Other specified polyneuropathies: Secondary | ICD-10-CM | POA: Diagnosis not present

## 2019-01-23 DIAGNOSIS — S92012D Displaced fracture of body of left calcaneus, subsequent encounter for fracture with routine healing: Secondary | ICD-10-CM | POA: Diagnosis not present

## 2019-01-23 DIAGNOSIS — E559 Vitamin D deficiency, unspecified: Secondary | ICD-10-CM | POA: Diagnosis not present

## 2019-01-23 NOTE — Telephone Encounter (Signed)
[facility] - Jay Warren is re-certifying pt for [place] and would like to know if the same wound care was to be continued and at 3 x week.

## 2019-01-26 ENCOUNTER — Telehealth: Payer: Self-pay | Admitting: *Deleted

## 2019-01-26 ENCOUNTER — Encounter: Payer: Self-pay | Admitting: Internal Medicine

## 2019-01-26 ENCOUNTER — Other Ambulatory Visit: Payer: Self-pay | Admitting: Internal Medicine

## 2019-01-26 ENCOUNTER — Ambulatory Visit (HOSPITAL_COMMUNITY)
Admission: RE | Admit: 2019-01-26 | Discharge: 2019-01-26 | Disposition: A | Discharge status: Home / Self Care | Payer: Medicare Other | Source: Ambulatory Visit | Attending: Internal Medicine | Admitting: Internal Medicine

## 2019-01-26 ENCOUNTER — Other Ambulatory Visit: Payer: Self-pay

## 2019-01-26 ENCOUNTER — Ambulatory Visit (INDEPENDENT_AMBULATORY_CARE_PROVIDER_SITE_OTHER): Payer: Medicare Other | Admitting: Internal Medicine

## 2019-01-26 DIAGNOSIS — M86172 Other acute osteomyelitis, left ankle and foot: Secondary | ICD-10-CM | POA: Diagnosis not present

## 2019-01-26 DIAGNOSIS — Z452 Encounter for adjustment and management of vascular access device: Secondary | ICD-10-CM | POA: Diagnosis not present

## 2019-01-26 DIAGNOSIS — Z85118 Personal history of other malignant neoplasm of bronchus and lung: Secondary | ICD-10-CM | POA: Diagnosis not present

## 2019-01-26 DIAGNOSIS — E559 Vitamin D deficiency, unspecified: Secondary | ICD-10-CM | POA: Diagnosis not present

## 2019-01-26 DIAGNOSIS — Z85841 Personal history of malignant neoplasm of brain: Secondary | ICD-10-CM | POA: Diagnosis not present

## 2019-01-26 DIAGNOSIS — Z9181 History of falling: Secondary | ICD-10-CM | POA: Diagnosis not present

## 2019-01-26 DIAGNOSIS — Z79891 Long term (current) use of opiate analgesic: Secondary | ICD-10-CM | POA: Diagnosis not present

## 2019-01-26 DIAGNOSIS — Z79899 Other long term (current) drug therapy: Secondary | ICD-10-CM | POA: Diagnosis not present

## 2019-01-26 DIAGNOSIS — I10 Essential (primary) hypertension: Secondary | ICD-10-CM | POA: Diagnosis not present

## 2019-01-26 DIAGNOSIS — S92012D Displaced fracture of body of left calcaneus, subsequent encounter for fracture with routine healing: Secondary | ICD-10-CM | POA: Diagnosis not present

## 2019-01-26 DIAGNOSIS — A4902 Methicillin resistant Staphylococcus aureus infection, unspecified site: Secondary | ICD-10-CM | POA: Diagnosis not present

## 2019-01-26 DIAGNOSIS — L89626 Pressure-induced deep tissue damage of left heel: Secondary | ICD-10-CM | POA: Diagnosis not present

## 2019-01-26 DIAGNOSIS — M810 Age-related osteoporosis without current pathological fracture: Secondary | ICD-10-CM | POA: Diagnosis not present

## 2019-01-26 DIAGNOSIS — G6289 Other specified polyneuropathies: Secondary | ICD-10-CM | POA: Diagnosis not present

## 2019-01-26 MED ORDER — LIDOCAINE HCL 1 % IJ SOLN
INTRAMUSCULAR | Status: AC
  Filled 2019-01-26: qty 20

## 2019-01-26 MED ORDER — HEPARIN SOD (PORK) LOCK FLUSH 100 UNIT/ML IV SOLN
INTRAVENOUS | Status: AC
  Filled 2019-01-26: qty 5

## 2019-01-26 MED ORDER — LIDOCAINE HCL (PF) 1 % IJ SOLN
INTRAMUSCULAR | Status: DC | PRN
  Administered 2019-01-26 (×2): 10 mL

## 2019-01-26 MED ORDER — HEPARIN SOD (PORK) LOCK FLUSH 100 UNIT/ML IV SOLN
INTRAVENOUS | Status: AC
  Administered 2019-01-26: 500 [IU]
  Filled 2019-01-26: qty 5

## 2019-01-26 NOTE — Telephone Encounter (Addendum)
Per Dr Linus Salmons patient is having PICC placed today at 59 am at [facility]. Patient information sent to [facility] for PICC teaching and nursing in home until he is done with his IV medication. Patient and his son aware that [facility] will be calling them later today to set up delivery and teaching. Information also sent to short stay for patient to have his first does of Vancomycin today after placement.   Patient order states that he is to be on Vanc for 6 weeks to start today 01/26/19 and last until 03/09/19.   Nursing for this patient will be [facility] ph [phone].  Allyson asked that I fax her order to [phone]

## 2019-01-26 NOTE — Telephone Encounter (Signed)
[place] states pt received PICC line and is to be on IV antibiotics and they need orders for the PICC line and antibiotic.

## 2019-01-26 NOTE — Telephone Encounter (Signed)
I informed [place] [facility] of Dr. Eleanora Neighbor orders of 01/26/2019 9:32am.

## 2019-01-26 NOTE — Telephone Encounter (Signed)
Continue

## 2019-01-26 NOTE — Telephone Encounter (Signed)
I informed [place] [facility] that Infectious Disease would handle the care and orders for the PICC line they started and she could contact [phone].

## 2019-01-26 NOTE — Procedures (Signed)
  Procedure: R 40cm SL PICC   EBL:   minimal Complications:  none immediate  See full dictation in [facility].  Dillard Cannon MD Main # [phone] Pager  [phone]

## 2019-01-26 NOTE — Progress Notes (Signed)
[place] for Infectious Disease      Reason for Consult: osteomyelitis   Referring Physician: Dr. March Rummage    Patient ID: Jay Warren, male    DOB: 1933/12/04, 85 y.o.   MRN: [id number]  HPI:   Here for evaluation of potential bone infection.  He developed a stress fracture of his left calcaneous and underwent surgical management with a 'mini open procedure with percutaneous pinning fixation (into the bone) done on 6/24.  Developed some drainage and redness in the area and did not close.  Continued to have issues and pins removed 8/11.  Xray by report in notes showed healing fracture but still with erythema and drainage.  A wound swab from the site of the pin was sent off and grew MRSA.  He has had no associated fever or chills.   Xray of foot independently reviewed and no significant concerns noted  Past Medical History:  Diagnosis Date  . BPH (benign prostatic hyperplasia)   . Brain cancer ([place])   . Hematuria   . Hyperlipidemia   . Hypertension   . Melkersson-Rosenthal syndrome   . MGUS (monoclonal gammopathy of unknown significance)   . Non-small cell lung cancer ([place])   . Osteoporosis   . Peripheral neuropathy   . Rosacea   . Seizure ([place])   . Vitamin D deficiency     Prior to Admission medications   Medication Sig Start Date End Date Taking? Authorizing Provider  alendronate (FOSAMAX) 70 MG tablet Take 1 tablet (70 mg total) by mouth once a week. Take with a full glass of water on an empty stomach. 10/27/15  Yes Chesley Mires, MD  atorvastatin (LIPITOR) 40 MG tablet  11/09/18  Yes [provider]  bisoprolol-hydrochlorothiazide (ZIAC) 10-6.25 MG tablet Take 1 tablet by mouth daily. 10/27/15  Yes Chesley Mires, MD  calcium carbonate (OS-CAL) 600 MG tablet Take 2 tablets (1,200 mg total) by mouth daily. 10/27/15  Yes Chesley Mires, MD  Cholecalciferol (VITAMIN D3 MAXIMUM STRENGTH) 5000 units capsule Take 1 capsule (5,000 Units total) by mouth daily. 10/27/15  Yes Chesley Mires, MD  collagenase (SANTYL) ointment Apply 1 application topically daily. Left foot wound measurements: 0.5 x 0.5 x 0.1cm, 0.5 x 0.5 x 0.1cm, and 0.5 x 0.5 x 0.1cm. 01/01/19  Yes Evelina Bucy, DPM  Delafloxacin Meglumine (BAXDELA) 450 MG TABS Take 1 tablet by mouth 2 (two) times daily. 01/16/19  Yes Evelina Bucy, DPM  furosemide (LASIX) 40 MG tablet  11/09/18  Yes [provider]  gabapentin (NEURONTIN) 100 MG capsule  11/15/18  Yes [provider]  HYDROcodone-acetaminophen (NORCO) 5-325 MG tablet Take 1 tablet by mouth every 6 (six) hours as needed for moderate pain. 11/18/18  Yes Evelina Bucy, DPM  HYDROcodone-acetaminophen (NORCO/VICODIN) 5-325 MG tablet  11/18/18  Yes [provider]  losartan (COZAAR) 50 MG tablet Take 1 tablet (50 mg total) by mouth daily. 10/27/15  Yes Chesley Mires, MD  Multiple Vitamins-Minerals (MULTIVITAMIN) tablet Take 1 tablet by mouth daily. 10/27/15  Yes Chesley Mires, MD  oxyCODONE-acetaminophen (PERCOCET) 10-325 MG tablet  11/26/18  Yes [provider]  oxyCODONE-acetaminophen (PERCOCET) 5-325 MG tablet Take 1 tablet by mouth every 4 (four) hours as needed for severe pain. 11/26/18  Yes Evelina Bucy, DPM  phenytoin (DILANTIN) 100 MG ER capsule Take 1 capsule (100 mg total) by mouth 3 (three) times daily. 10/27/15  Yes Chesley Mires, MD  phenytoin (DILANTIN) 100 MG ER capsule  11/24/18  Yes [provider]  simvastatin (ZOCOR) 80 MG tablet Take 1 tablet (80 mg total) by mouth at bedtime. 10/27/15  Yes Chesley Mires, MD  tamsulosin (FLOMAX) 0.4 MG CAPS capsule  11/24/18  Yes [provider]    No Known Allergies  Social History   Tobacco Use  . Smoking status: Former Smoker    Packs/day: 2.00    Years: 30.00    Pack years: 60.00    Types: Cigarettes    Quit date: 06/04/1994    Years since quitting: 24.6  . Smokeless tobacco: Never Used  Substance Use Topics  . Alcohol use: No    Alcohol/week: 0.0  standard drinks  . Drug use: No    Family History  Problem Relation Age of Onset  . ALS Father        DC'd  . Heart attack Sister     Review of Systems  Constitutional: negative for fevers, chills and malaise Gastrointestinal: negative for nausea and diarrhea Integument/breast: negative for rash All other systems reviewed and are negative    Constitutional: in no apparent distress  Vitals:   01/26/19 1015  BP: 135/80  Pulse: 80   EYES: anicteric Cardiovascular: Cor RRR Respiratory: normal respiratory effort GI: soft, nt Musculoskeletal: foot in boot Skin: negatives: no rash Neuro: non focal in wheelchair  Labs: No results found for: WBC, HGB, HCT, MCV, PLT No results found for: CREATININE, BUN, NA, K, CL, CO2 No results found for: ALT, AST, GGT, ALKPHOS, BILITOT, INR   Assessment: osteomyelitis based on previous pin, culture from pin site, some poor healing.  Discussed bone infection.  Ideal to treat with IV therapy and will use vancomycin based on culture with MRSA.  Will put in for 6 weeks though if it is healing well, will consider transition to doxycycline after 3 or 4 weeks.    Plan: 1) picc 2) home health 3) vancomycin for 6 weeks through about 10/5 Return in about 2 weeks.

## 2019-01-27 ENCOUNTER — Other Ambulatory Visit: Payer: Self-pay | Admitting: Podiatry

## 2019-01-27 ENCOUNTER — Encounter: Payer: Self-pay | Admitting: Podiatry

## 2019-01-27 ENCOUNTER — Ambulatory Visit (INDEPENDENT_AMBULATORY_CARE_PROVIDER_SITE_OTHER): Payer: Self-pay | Admitting: Podiatry

## 2019-01-27 ENCOUNTER — Ambulatory Visit (INDEPENDENT_AMBULATORY_CARE_PROVIDER_SITE_OTHER): Payer: Medicare Other

## 2019-01-27 ENCOUNTER — Other Ambulatory Visit: Payer: Self-pay

## 2019-01-27 VITALS — Temp 97.8°F | Resp 16

## 2019-01-27 DIAGNOSIS — M810 Age-related osteoporosis without current pathological fracture: Secondary | ICD-10-CM | POA: Diagnosis not present

## 2019-01-27 DIAGNOSIS — Z9889 Other specified postprocedural states: Secondary | ICD-10-CM

## 2019-01-27 DIAGNOSIS — L97421 Non-pressure chronic ulcer of left heel and midfoot limited to breakdown of skin: Secondary | ICD-10-CM | POA: Diagnosis not present

## 2019-01-27 DIAGNOSIS — L97422 Non-pressure chronic ulcer of left heel and midfoot with fat layer exposed: Secondary | ICD-10-CM

## 2019-01-27 DIAGNOSIS — L89626 Pressure-induced deep tissue damage of left heel: Secondary | ICD-10-CM | POA: Diagnosis not present

## 2019-01-27 DIAGNOSIS — L97411 Non-pressure chronic ulcer of right heel and midfoot limited to breakdown of skin: Secondary | ICD-10-CM

## 2019-01-27 DIAGNOSIS — Z452 Encounter for adjustment and management of vascular access device: Secondary | ICD-10-CM | POA: Diagnosis not present

## 2019-01-27 DIAGNOSIS — S92012D Displaced fracture of body of left calcaneus, subsequent encounter for fracture with routine healing: Secondary | ICD-10-CM | POA: Diagnosis not present

## 2019-01-27 DIAGNOSIS — A4902 Methicillin resistant Staphylococcus aureus infection, unspecified site: Secondary | ICD-10-CM | POA: Diagnosis not present

## 2019-01-27 DIAGNOSIS — I10 Essential (primary) hypertension: Secondary | ICD-10-CM | POA: Diagnosis not present

## 2019-01-27 LAB — CBC WITH DIFFERENTIAL/PLATELET
Absolute Monocytes: 1008 cells/uL — ABNORMAL HIGH (ref 200–950)
Basophils Absolute: 42 cells/uL (ref 0–200)
Basophils Relative: 0.5 %
Eosinophils Absolute: 176 cells/uL (ref 15–500)
Eosinophils Relative: 2.1 %
HCT: 41.4 % (ref 38.5–50.0)
Hemoglobin: 14.3 g/dL (ref 13.2–17.1)
Lymphs Abs: 1201 cells/uL (ref 850–3900)
MCH: 31 pg (ref 27.0–33.0)
MCHC: 34.5 g/dL (ref 32.0–36.0)
MCV: 89.6 fL (ref 80.0–100.0)
MPV: 10 fL (ref 7.5–12.5)
Monocytes Relative: 12 %
Neutro Abs: 5972 cells/uL (ref 1500–7800)
Neutrophils Relative %: 71.1 %
Platelets: 235 10*3/uL (ref 140–400)
RBC: 4.62 10*6/uL (ref 4.20–5.80)
RDW: 11.3 % (ref 11.0–15.0)
Total Lymphocyte: 14.3 %
WBC: 8.4 10*3/uL (ref 3.8–10.8)

## 2019-01-27 LAB — COMPREHENSIVE METABOLIC PANEL
AG Ratio: 1.1 (calc) (ref 1.0–2.5)
ALT: 12 U/L (ref 9–46)
AST: 15 U/L (ref 10–35)
Albumin: 3.8 g/dL (ref 3.6–5.1)
Alkaline phosphatase (APISO): 133 U/L (ref 35–144)
BUN/Creatinine Ratio: 17 (calc) (ref 6–22)
BUN: 11 mg/dL (ref 7–25)
CO2: 27 mmol/L (ref 20–32)
Calcium: 9 mg/dL (ref 8.6–10.3)
Chloride: 96 mmol/L — ABNORMAL LOW (ref 98–110)
Creat: 0.63 mg/dL — ABNORMAL LOW (ref 0.70–1.11)
Globulin: 3.6 g/dL (calc) (ref 1.9–3.7)
Glucose, Bld: 91 mg/dL (ref 65–99)
Potassium: 4.3 mmol/L (ref 3.5–5.3)
Sodium: 131 mmol/L — ABNORMAL LOW (ref 135–146)
Total Bilirubin: 0.4 mg/dL (ref 0.2–1.2)
Total Protein: 7.4 g/dL (ref 6.1–8.1)

## 2019-01-27 LAB — SEDIMENTATION RATE: Sed Rate: 14 mm/h (ref 0–20)

## 2019-01-27 NOTE — Telephone Encounter (Signed)
Patient scheduled for first dose of Vancomycin 01/28/19 at 8 am at [facility]. Order faxed to [phone].  Labs and office note faxed to [facility] and [facility] at [place] and both are aware of first dose date and time.  Spoke with patient and son Jay Warren and they are aware of the date, time and place. Advised them to call the office if they have any problems or questions

## 2019-01-28 DIAGNOSIS — A4902 Methicillin resistant Staphylococcus aureus infection, unspecified site: Secondary | ICD-10-CM | POA: Diagnosis not present

## 2019-01-28 DIAGNOSIS — M86172 Other acute osteomyelitis, left ankle and foot: Secondary | ICD-10-CM | POA: Diagnosis not present

## 2019-01-28 DIAGNOSIS — L89626 Pressure-induced deep tissue damage of left heel: Secondary | ICD-10-CM | POA: Diagnosis not present

## 2019-01-28 DIAGNOSIS — Z452 Encounter for adjustment and management of vascular access device: Secondary | ICD-10-CM | POA: Diagnosis not present

## 2019-01-28 DIAGNOSIS — M810 Age-related osteoporosis without current pathological fracture: Secondary | ICD-10-CM | POA: Diagnosis not present

## 2019-01-28 DIAGNOSIS — I10 Essential (primary) hypertension: Secondary | ICD-10-CM | POA: Diagnosis not present

## 2019-01-28 DIAGNOSIS — S92012D Displaced fracture of body of left calcaneus, subsequent encounter for fracture with routine healing: Secondary | ICD-10-CM | POA: Diagnosis not present

## 2019-01-29 DIAGNOSIS — L89626 Pressure-induced deep tissue damage of left heel: Secondary | ICD-10-CM | POA: Diagnosis not present

## 2019-01-29 DIAGNOSIS — I10 Essential (primary) hypertension: Secondary | ICD-10-CM | POA: Diagnosis not present

## 2019-01-29 DIAGNOSIS — A4902 Methicillin resistant Staphylococcus aureus infection, unspecified site: Secondary | ICD-10-CM | POA: Diagnosis not present

## 2019-01-29 DIAGNOSIS — S92012D Displaced fracture of body of left calcaneus, subsequent encounter for fracture with routine healing: Secondary | ICD-10-CM | POA: Diagnosis not present

## 2019-01-29 DIAGNOSIS — M810 Age-related osteoporosis without current pathological fracture: Secondary | ICD-10-CM | POA: Diagnosis not present

## 2019-01-29 DIAGNOSIS — Z452 Encounter for adjustment and management of vascular access device: Secondary | ICD-10-CM | POA: Diagnosis not present

## 2019-01-30 ENCOUNTER — Other Ambulatory Visit: Payer: Self-pay | Admitting: Podiatry

## 2019-01-30 DIAGNOSIS — S92012D Displaced fracture of body of left calcaneus, subsequent encounter for fracture with routine healing: Secondary | ICD-10-CM | POA: Diagnosis not present

## 2019-01-30 DIAGNOSIS — I10 Essential (primary) hypertension: Secondary | ICD-10-CM | POA: Diagnosis not present

## 2019-01-30 DIAGNOSIS — L89626 Pressure-induced deep tissue damage of left heel: Secondary | ICD-10-CM | POA: Diagnosis not present

## 2019-01-30 DIAGNOSIS — L97421 Non-pressure chronic ulcer of left heel and midfoot limited to breakdown of skin: Secondary | ICD-10-CM

## 2019-01-30 DIAGNOSIS — Z452 Encounter for adjustment and management of vascular access device: Secondary | ICD-10-CM | POA: Diagnosis not present

## 2019-01-30 DIAGNOSIS — Z9889 Other specified postprocedural states: Secondary | ICD-10-CM

## 2019-01-30 DIAGNOSIS — M810 Age-related osteoporosis without current pathological fracture: Secondary | ICD-10-CM | POA: Diagnosis not present

## 2019-01-30 DIAGNOSIS — A4902 Methicillin resistant Staphylococcus aureus infection, unspecified site: Secondary | ICD-10-CM | POA: Diagnosis not present

## 2019-02-02 ENCOUNTER — Other Ambulatory Visit: Payer: Self-pay | Admitting: Podiatry

## 2019-02-02 DIAGNOSIS — I10 Essential (primary) hypertension: Secondary | ICD-10-CM | POA: Diagnosis not present

## 2019-02-02 DIAGNOSIS — S92012D Displaced fracture of body of left calcaneus, subsequent encounter for fracture with routine healing: Secondary | ICD-10-CM | POA: Diagnosis not present

## 2019-02-02 DIAGNOSIS — A4902 Methicillin resistant Staphylococcus aureus infection, unspecified site: Secondary | ICD-10-CM | POA: Diagnosis not present

## 2019-02-02 DIAGNOSIS — Z452 Encounter for adjustment and management of vascular access device: Secondary | ICD-10-CM | POA: Diagnosis not present

## 2019-02-02 DIAGNOSIS — L89626 Pressure-induced deep tissue damage of left heel: Secondary | ICD-10-CM | POA: Diagnosis not present

## 2019-02-02 DIAGNOSIS — Z9889 Other specified postprocedural states: Secondary | ICD-10-CM

## 2019-02-02 DIAGNOSIS — L97421 Non-pressure chronic ulcer of left heel and midfoot limited to breakdown of skin: Secondary | ICD-10-CM

## 2019-02-02 DIAGNOSIS — M810 Age-related osteoporosis without current pathological fracture: Secondary | ICD-10-CM | POA: Diagnosis not present

## 2019-02-02 DIAGNOSIS — B9562 Methicillin resistant Staphylococcus aureus infection as the cause of diseases classified elsewhere: Secondary | ICD-10-CM | POA: Diagnosis not present

## 2019-02-02 DIAGNOSIS — Z5181 Encounter for therapeutic drug level monitoring: Secondary | ICD-10-CM | POA: Diagnosis not present

## 2019-02-03 ENCOUNTER — Ambulatory Visit (INDEPENDENT_AMBULATORY_CARE_PROVIDER_SITE_OTHER): Payer: Medicare Other | Admitting: Podiatry

## 2019-02-03 ENCOUNTER — Other Ambulatory Visit: Payer: Self-pay

## 2019-02-03 ENCOUNTER — Encounter: Payer: Self-pay | Admitting: Podiatry

## 2019-02-03 ENCOUNTER — Other Ambulatory Visit: Payer: Self-pay | Admitting: Podiatry

## 2019-02-03 VITALS — Temp 97.9°F | Resp 16

## 2019-02-03 DIAGNOSIS — Z9889 Other specified postprocedural states: Secondary | ICD-10-CM

## 2019-02-03 DIAGNOSIS — L97422 Non-pressure chronic ulcer of left heel and midfoot with fat layer exposed: Secondary | ICD-10-CM

## 2019-02-03 NOTE — Progress Notes (Signed)
Subjective:  Patient ID: Jay Warren, male    DOB: 09-17-1933,  MRN: [id number]  Chief Complaint  Patient presents with  . Wound Check    F?U Lt wound check PT. states," nurse yestday cleaned a lot fo the yellow off and still sore and red." tx: santly and boot -w/ redness and swelling and more drainage -picc line applied yesterday     DOS: 11/26/2018 Procedure: Open reduction percutaneous pinning of left calcaneus fracture  85 y.o. male returns for post-op check.  History as above. Saw ID and now on PICC line.  Review of Systems: Negative except as noted in the HPI. Denies N/V/F/Ch.  Past Medical History:  Diagnosis Date  . BPH (benign prostatic hyperplasia)   . Brain cancer ([place])   . Hematuria   . Hyperlipidemia   . Hypertension   . Melkersson-Rosenthal syndrome   . MGUS (monoclonal gammopathy of unknown significance)   . Non-small cell lung cancer ([place])   . Osteoporosis   . Peripheral neuropathy   . Rosacea   . Seizure ([place])   . Vitamin D deficiency     Current Outpatient Medications:  .  alendronate (FOSAMAX) 70 MG tablet, Take 1 tablet (70 mg total) by mouth once a week. Take with a full glass of water on an empty stomach., Disp: 4 tablet, Rfl: 0 .  atorvastatin (LIPITOR) 40 MG tablet, , Disp: , Rfl:  .  bisoprolol-hydrochlorothiazide (ZIAC) 10-6.25 MG tablet, Take 1 tablet by mouth daily., Disp: 30 tablet, Rfl: 0 .  calcium carbonate (OS-CAL) 600 MG tablet, Take 2 tablets (1,200 mg total) by mouth daily., Disp: 60 tablet, Rfl: 0 .  Cholecalciferol (VITAMIN D3 MAXIMUM STRENGTH) 5000 units capsule, Take 1 capsule (5,000 Units total) by mouth daily., Disp: 30 capsule, Rfl: 0 .  collagenase (SANTYL) ointment, Apply 1 application topically daily. Left foot wound measurements: 0.5 x 0.5 x 0.1cm, 0.5 x 0.5 x 0.1cm, and 0.5 x 0.5 x 0.1cm., Disp: 30 g, Rfl: 5 .  Delafloxacin Meglumine (BAXDELA) 450 MG TABS, Take 1 tablet by mouth 2 (two) times daily., Disp: 14 tablet, Rfl: 0 .   furosemide (LASIX) 40 MG tablet, , Disp: , Rfl:  .  gabapentin (NEURONTIN) 100 MG capsule, , Disp: , Rfl:  .  HYDROcodone-acetaminophen (NORCO) 5-325 MG tablet, Take 1 tablet by mouth every 6 (six) hours as needed for moderate pain., Disp: 12 tablet, Rfl: 0 .  HYDROcodone-acetaminophen (NORCO/VICODIN) 5-325 MG tablet, , Disp: , Rfl:  .  losartan (COZAAR) 50 MG tablet, Take 1 tablet (50 mg total) by mouth daily., Disp: 30 tablet, Rfl: 0 .  Multiple Vitamins-Minerals (MULTIVITAMIN) tablet, Take 1 tablet by mouth daily., Disp: 30 tablet, Rfl: 0 .  oxyCODONE-acetaminophen (PERCOCET) 10-325 MG tablet, , Disp: , Rfl:  .  oxyCODONE-acetaminophen (PERCOCET) 5-325 MG tablet, Take 1 tablet by mouth every 4 (four) hours as needed for severe pain., Disp: 20 tablet, Rfl: 0 .  phenytoin (DILANTIN) 100 MG ER capsule, Take 1 capsule (100 mg total) by mouth 3 (three) times daily., Disp: 90 capsule, Rfl: 0 .  phenytoin (DILANTIN) 100 MG ER capsule, , Disp: , Rfl:  .  simvastatin (ZOCOR) 80 MG tablet, Take 1 tablet (80 mg total) by mouth at bedtime., Disp: 30 tablet, Rfl: 0 .  tamsulosin (FLOMAX) 0.4 MG CAPS capsule, , Disp: , Rfl:  .  vancomycin (VANCOCIN) 10 G SOLR injection, , Disp: , Rfl:   Social History   Tobacco Use  Smoking Status Former  Smoker  . Packs/day: 2.00  . Years: 30.00  . Pack years: 60.00  . Types: Cigarettes  . Quit date: 06/04/1994  . Years since quitting: 24.6  Smokeless Tobacco Never Used    No Known Allergies Objective:   Vitals:   01/27/19 0958  Resp: 16  Temp: 97.8 F (36.6 C)   There is no height or weight on file to calculate BMI. Constitutional Well developed. Well nourished.  Vascular Foot warm and well perfused. Capillary refill normal to all digits.   Neurologic Normal speech. Oriented to person, place, and time. Epicritic sensation to light touch grossly present bilaterally.  Dermatologic Still with cellulitis, fibrotic wound, no active purulence. No exposed  bone. No probe to bone.  Orthopedic: No tenderness to palpation noted about the surgical site. No pain with inversion/eversion of the heel. Pins intact   Radiographs: Taken and reviewed, again with fracture displacement, subtalar joint depression, no osseous erosion noted. Assessment:   1. Ulcer of heel, left, with fat layer exposed ([place])    Plan:  Patient was evaluated and treated and all questions answered.  S/p foot surgery left -XR taken and reviewed with patient and son, progression shown since post-op. Patient had good positioning post-op which did deteriorate over time. Patient did have difficulty maintaining strict NWB and had soft bone, and ultimately his pins had to be removed due to skin irritation. -He is not currently having any pain -Discussed now the focus is on getting his wound to heal and treating the skin infection. -Unclear if the infection does involve the bone. If the infection does not significantly improve will consider MRI/CT. -Continue NWB. -Continue Abx with ID input. On PICC line.  Heel Wound left -Continue santyl WTD  Return in about 1 week (around 02/03/2019).

## 2019-02-04 ENCOUNTER — Telehealth: Payer: Self-pay

## 2019-02-04 DIAGNOSIS — Z452 Encounter for adjustment and management of vascular access device: Secondary | ICD-10-CM | POA: Diagnosis not present

## 2019-02-04 DIAGNOSIS — L89626 Pressure-induced deep tissue damage of left heel: Secondary | ICD-10-CM | POA: Diagnosis not present

## 2019-02-04 DIAGNOSIS — M810 Age-related osteoporosis without current pathological fracture: Secondary | ICD-10-CM | POA: Diagnosis not present

## 2019-02-04 DIAGNOSIS — A4902 Methicillin resistant Staphylococcus aureus infection, unspecified site: Secondary | ICD-10-CM | POA: Diagnosis not present

## 2019-02-04 DIAGNOSIS — Z5181 Encounter for therapeutic drug level monitoring: Secondary | ICD-10-CM | POA: Diagnosis not present

## 2019-02-04 DIAGNOSIS — S92012D Displaced fracture of body of left calcaneus, subsequent encounter for fracture with routine healing: Secondary | ICD-10-CM | POA: Diagnosis not present

## 2019-02-04 DIAGNOSIS — I10 Essential (primary) hypertension: Secondary | ICD-10-CM | POA: Diagnosis not present

## 2019-02-04 NOTE — Telephone Encounter (Signed)
Received labs from [facility].  Vancomycin trough: 6.4 Cr: 0.50 Sed rate: 15 CPR: 32.4  Labs placed in pharmacy box for review.  Vanc end date 10/5 [facility]

## 2019-02-04 NOTE — Telephone Encounter (Signed)
Ok, got it.

## 2019-02-06 DIAGNOSIS — I10 Essential (primary) hypertension: Secondary | ICD-10-CM | POA: Diagnosis not present

## 2019-02-06 DIAGNOSIS — S92012D Displaced fracture of body of left calcaneus, subsequent encounter for fracture with routine healing: Secondary | ICD-10-CM | POA: Diagnosis not present

## 2019-02-06 DIAGNOSIS — A4902 Methicillin resistant Staphylococcus aureus infection, unspecified site: Secondary | ICD-10-CM | POA: Diagnosis not present

## 2019-02-06 DIAGNOSIS — L89626 Pressure-induced deep tissue damage of left heel: Secondary | ICD-10-CM | POA: Diagnosis not present

## 2019-02-06 DIAGNOSIS — M810 Age-related osteoporosis without current pathological fracture: Secondary | ICD-10-CM | POA: Diagnosis not present

## 2019-02-06 DIAGNOSIS — Z452 Encounter for adjustment and management of vascular access device: Secondary | ICD-10-CM | POA: Diagnosis not present

## 2019-02-09 DIAGNOSIS — M810 Age-related osteoporosis without current pathological fracture: Secondary | ICD-10-CM | POA: Diagnosis not present

## 2019-02-09 DIAGNOSIS — S92012D Displaced fracture of body of left calcaneus, subsequent encounter for fracture with routine healing: Secondary | ICD-10-CM | POA: Diagnosis not present

## 2019-02-09 DIAGNOSIS — I10 Essential (primary) hypertension: Secondary | ICD-10-CM | POA: Diagnosis not present

## 2019-02-09 DIAGNOSIS — Z452 Encounter for adjustment and management of vascular access device: Secondary | ICD-10-CM | POA: Diagnosis not present

## 2019-02-09 DIAGNOSIS — A4902 Methicillin resistant Staphylococcus aureus infection, unspecified site: Secondary | ICD-10-CM | POA: Diagnosis not present

## 2019-02-09 DIAGNOSIS — L89626 Pressure-induced deep tissue damage of left heel: Secondary | ICD-10-CM | POA: Diagnosis not present

## 2019-02-10 ENCOUNTER — Encounter: Payer: Self-pay | Admitting: Internal Medicine

## 2019-02-10 ENCOUNTER — Other Ambulatory Visit: Payer: Self-pay

## 2019-02-10 ENCOUNTER — Ambulatory Visit (INDEPENDENT_AMBULATORY_CARE_PROVIDER_SITE_OTHER): Payer: Medicare Other | Admitting: Internal Medicine

## 2019-02-10 VITALS — BP 136/80 | HR 74 | Temp 97.7°F

## 2019-02-10 DIAGNOSIS — Z5181 Encounter for therapeutic drug level monitoring: Secondary | ICD-10-CM

## 2019-02-10 DIAGNOSIS — Z452 Encounter for adjustment and management of vascular access device: Secondary | ICD-10-CM | POA: Insufficient documentation

## 2019-02-10 DIAGNOSIS — M86172 Other acute osteomyelitis, left ankle and foot: Secondary | ICD-10-CM

## 2019-02-10 NOTE — Assessment & Plan Note (Addendum)
picc line was placed successfully and no issues with its use.   Continue with usual care.

## 2019-02-10 NOTE — Assessment & Plan Note (Signed)
Improving and will continue for 6 weeks with IV vancomycin through 10/6 He will follow up with me that first week of Oct

## 2019-02-10 NOTE — Progress Notes (Signed)
   Subjective:    Patient ID: Jay Warren, male    DOB: 09-25-1933, 85 y.o.   MRN: [id number]  HPI Here for follow up of osteomyelitis.  Developed a stress fracture and underwent pinning but had to be removed due to infection with drainage.  Pins removed out of bone and culture with MRSA.  Healing fracture.  I started him on IV vancomycin with presumed osteomyelitis based on pin/site culture.  He is tolerating well, though trough a bit low initially, no issues with creat.  Heel continues to improve.  He has no complaints.    Review of Systems  Constitutional: Negative for fatigue.  Gastrointestinal: Negative for diarrhea and nausea.  Skin: Negative for rash.       Objective:   Physical Exam Constitutional:      Appearance: Normal appearance.  Eyes:     General: No scleral icterus. Musculoskeletal:     Comments: Wound with no surrounding erythema, no draiange.  Good tissue at base and does not probe to bone.   Neurological:     Mental Status: He is alert.   SH: no tobacco        Assessment & Plan:

## 2019-02-10 NOTE — Assessment & Plan Note (Signed)
Creat has remained stable.  Will continue to monitor twice a week.

## 2019-02-11 ENCOUNTER — Encounter: Payer: Self-pay | Admitting: Family Medicine

## 2019-02-11 ENCOUNTER — Encounter: Payer: Medicare Other | Admitting: Sports Medicine

## 2019-02-11 DIAGNOSIS — I110 Hypertensive heart disease with heart failure: Secondary | ICD-10-CM | POA: Diagnosis not present

## 2019-02-11 DIAGNOSIS — I10 Essential (primary) hypertension: Secondary | ICD-10-CM | POA: Diagnosis not present

## 2019-02-11 DIAGNOSIS — Z452 Encounter for adjustment and management of vascular access device: Secondary | ICD-10-CM | POA: Diagnosis not present

## 2019-02-11 DIAGNOSIS — L89626 Pressure-induced deep tissue damage of left heel: Secondary | ICD-10-CM | POA: Diagnosis not present

## 2019-02-11 DIAGNOSIS — A4902 Methicillin resistant Staphylococcus aureus infection, unspecified site: Secondary | ICD-10-CM | POA: Diagnosis not present

## 2019-02-11 DIAGNOSIS — S92012D Displaced fracture of body of left calcaneus, subsequent encounter for fracture with routine healing: Secondary | ICD-10-CM | POA: Diagnosis not present

## 2019-02-11 DIAGNOSIS — M810 Age-related osteoporosis without current pathological fracture: Secondary | ICD-10-CM | POA: Diagnosis not present

## 2019-02-13 DIAGNOSIS — M810 Age-related osteoporosis without current pathological fracture: Secondary | ICD-10-CM | POA: Diagnosis not present

## 2019-02-13 DIAGNOSIS — Z452 Encounter for adjustment and management of vascular access device: Secondary | ICD-10-CM | POA: Diagnosis not present

## 2019-02-13 DIAGNOSIS — I10 Essential (primary) hypertension: Secondary | ICD-10-CM | POA: Diagnosis not present

## 2019-02-13 DIAGNOSIS — S92012D Displaced fracture of body of left calcaneus, subsequent encounter for fracture with routine healing: Secondary | ICD-10-CM | POA: Diagnosis not present

## 2019-02-13 DIAGNOSIS — A4902 Methicillin resistant Staphylococcus aureus infection, unspecified site: Secondary | ICD-10-CM | POA: Diagnosis not present

## 2019-02-13 DIAGNOSIS — L89626 Pressure-induced deep tissue damage of left heel: Secondary | ICD-10-CM | POA: Diagnosis not present

## 2019-02-15 NOTE — Progress Notes (Signed)
Subjective:  Patient ID: [name], male    DOB: 18-Nov-1933,  MRN: [id number]  Chief Complaint  Patient presents with  . Foot Ulcer    F/U Lt ucler Pt. states," it's better, nothing abnormal." tx: santyl and dressing -w/ less drianage and redness -pt denie sN/V/F/Ch     DOS: 11/26/2018 Procedure: Open reduction percutaneous pinning of left calcaneus fracture  85 y.o. male returns for post-op check.  History as above. On abx with PICC. [place] coming for help with IV Abx and wound care.   Review of Systems: Negative except as noted in the HPI. Denies N/V/F/Ch.  Past Medical History:  Diagnosis Date  . BPH (benign prostatic hyperplasia)   . Brain cancer ([place])   . Hematuria   . Hyperlipidemia   . Hypertension   . Melkersson-Rosenthal syndrome   . MGUS (monoclonal gammopathy of unknown significance)   . Non-small cell lung cancer ([place])   . Osteoporosis   . Peripheral neuropathy   . Rosacea   . Seizure ([place])   . Vitamin D deficiency     Current Outpatient Medications:  .  alendronate (FOSAMAX) 70 MG tablet, Take 1 tablet (70 mg total) by mouth once a week. Take with a full glass of water on an empty stomach., Disp: 4 tablet, Rfl: 0 .  atorvastatin (LIPITOR) 40 MG tablet, , Disp: , Rfl:  .  bisoprolol-hydrochlorothiazide (ZIAC) 10-6.25 MG tablet, Take 1 tablet by mouth daily., Disp: 30 tablet, Rfl: 0 .  calcium carbonate (OS-CAL) 600 MG tablet, Take 2 tablets (1,200 mg total) by mouth daily., Disp: 60 tablet, Rfl: 0 .  Cholecalciferol (VITAMIN D3 MAXIMUM STRENGTH) 5000 units capsule, Take 1 capsule (5,000 Units total) by mouth daily., Disp: 30 capsule, Rfl: 0 .  collagenase (SANTYL) ointment, Apply 1 application topically daily. Left foot wound measurements: 0.5 x 0.5 x 0.1cm, 0.5 x 0.5 x 0.1cm, and 0.5 x 0.5 x 0.1cm., Disp: 30 g, Rfl: 5 .  Delafloxacin Meglumine (BAXDELA) 450 MG TABS, Take 1 tablet by mouth 2 (two) times daily., Disp: 14 tablet, Rfl: 0 .  furosemide (LASIX) 40 MG  tablet, , Disp: , Rfl:  .  gabapentin (NEURONTIN) 100 MG capsule, , Disp: , Rfl:  .  HYDROcodone-acetaminophen (NORCO) 5-325 MG tablet, Take 1 tablet by mouth every 6 (six) hours as needed for moderate pain., Disp: 12 tablet, Rfl: 0 .  HYDROcodone-acetaminophen (NORCO/VICODIN) 5-325 MG tablet, , Disp: , Rfl:  .  losartan (COZAAR) 50 MG tablet, Take 1 tablet (50 mg total) by mouth daily., Disp: 30 tablet, Rfl: 0 .  Multiple Vitamins-Minerals (MULTIVITAMIN) tablet, Take 1 tablet by mouth daily., Disp: 30 tablet, Rfl: 0 .  oxyCODONE-acetaminophen (PERCOCET) 10-325 MG tablet, , Disp: , Rfl:  .  oxyCODONE-acetaminophen (PERCOCET) 5-325 MG tablet, Take 1 tablet by mouth every 4 (four) hours as needed for severe pain., Disp: 20 tablet, Rfl: 0 .  phenytoin (DILANTIN) 100 MG ER capsule, Take 1 capsule (100 mg total) by mouth 3 (three) times daily., Disp: 90 capsule, Rfl: 0 .  phenytoin (DILANTIN) 100 MG ER capsule, , Disp: , Rfl:  .  simvastatin (ZOCOR) 80 MG tablet, Take 1 tablet (80 mg total) by mouth at bedtime., Disp: 30 tablet, Rfl: 0 .  tamsulosin (FLOMAX) 0.4 MG CAPS capsule, , Disp: , Rfl:  .  vancomycin (VANCOCIN) 10 G SOLR injection, , Disp: , Rfl:   Social History   Tobacco Use  Smoking Status Former Smoker  . Packs/day: 2.00  .  Years: 30.00  . Pack years: 60.00  . Types: Cigarettes  . Quit date: 06/04/1994  . Years since quitting: 24.7  Smokeless Tobacco Never Used    No Known Allergies Objective:   Vitals:   02/03/19 1046  Resp: 16  Temp: 97.9 F (36.6 C)   There is no height or weight on file to calculate BMI. Constitutional Well developed. Well nourished.  Vascular Foot warm and well perfused. Capillary refill normal to all digits.   Neurologic Normal speech. Oriented to person, place, and time. Epicritic sensation to light touch grossly present bilaterally.  Dermatologic Decreasing cellulitis, fibrotic wound, no active purulence. No exposed bone. No probe to bone.   Orthopedic: No tenderness to palpation noted about the surgical site. No pain with inversion/eversion of the heel. Pins intact   Radiographs: None Assessment:   1. Ulcer of heel, left, with fat layer exposed ([place])   2. Post-operative state    Plan:  Patient was evaluated and treated and all questions answered.  S/p foot surgery left -Wound continues to improve. Cellulitis receding.  -Continue NWB. -Continue Abx with ID input. On PICC line. -Wound debrided left heel. Superficial debridement. Covered under global period.  Heel Wound left -Continue santyl WTD  No follow-ups on file.

## 2019-02-16 ENCOUNTER — Encounter: Payer: Self-pay | Admitting: Internal Medicine

## 2019-02-16 DIAGNOSIS — L89626 Pressure-induced deep tissue damage of left heel: Secondary | ICD-10-CM | POA: Diagnosis not present

## 2019-02-16 DIAGNOSIS — A4902 Methicillin resistant Staphylococcus aureus infection, unspecified site: Secondary | ICD-10-CM | POA: Diagnosis not present

## 2019-02-16 DIAGNOSIS — M810 Age-related osteoporosis without current pathological fracture: Secondary | ICD-10-CM | POA: Diagnosis not present

## 2019-02-16 DIAGNOSIS — Z452 Encounter for adjustment and management of vascular access device: Secondary | ICD-10-CM | POA: Diagnosis not present

## 2019-02-16 DIAGNOSIS — S92012D Displaced fracture of body of left calcaneus, subsequent encounter for fracture with routine healing: Secondary | ICD-10-CM | POA: Diagnosis not present

## 2019-02-16 DIAGNOSIS — I10 Essential (primary) hypertension: Secondary | ICD-10-CM | POA: Diagnosis not present

## 2019-02-16 DIAGNOSIS — Z79899 Other long term (current) drug therapy: Secondary | ICD-10-CM | POA: Diagnosis not present

## 2019-02-17 ENCOUNTER — Ambulatory Visit (INDEPENDENT_AMBULATORY_CARE_PROVIDER_SITE_OTHER): Payer: Self-pay | Admitting: Podiatry

## 2019-02-17 ENCOUNTER — Other Ambulatory Visit: Payer: Self-pay

## 2019-02-17 DIAGNOSIS — L97422 Non-pressure chronic ulcer of left heel and midfoot with fat layer exposed: Secondary | ICD-10-CM

## 2019-02-18 ENCOUNTER — Encounter: Payer: Self-pay | Admitting: Internal Medicine

## 2019-02-18 DIAGNOSIS — Z452 Encounter for adjustment and management of vascular access device: Secondary | ICD-10-CM | POA: Diagnosis not present

## 2019-02-18 DIAGNOSIS — Z79899 Other long term (current) drug therapy: Secondary | ICD-10-CM | POA: Diagnosis not present

## 2019-02-18 DIAGNOSIS — E785 Hyperlipidemia, unspecified: Secondary | ICD-10-CM | POA: Diagnosis not present

## 2019-02-18 DIAGNOSIS — I10 Essential (primary) hypertension: Secondary | ICD-10-CM | POA: Diagnosis not present

## 2019-02-18 DIAGNOSIS — Z125 Encounter for screening for malignant neoplasm of prostate: Secondary | ICD-10-CM | POA: Diagnosis not present

## 2019-02-18 DIAGNOSIS — Z0000 Encounter for general adult medical examination without abnormal findings: Secondary | ICD-10-CM | POA: Diagnosis not present

## 2019-02-18 DIAGNOSIS — Z1331 Encounter for screening for depression: Secondary | ICD-10-CM | POA: Diagnosis not present

## 2019-02-18 DIAGNOSIS — Z9181 History of falling: Secondary | ICD-10-CM | POA: Diagnosis not present

## 2019-02-18 DIAGNOSIS — L89626 Pressure-induced deep tissue damage of left heel: Secondary | ICD-10-CM | POA: Diagnosis not present

## 2019-02-18 DIAGNOSIS — A4902 Methicillin resistant Staphylococcus aureus infection, unspecified site: Secondary | ICD-10-CM | POA: Diagnosis not present

## 2019-02-18 DIAGNOSIS — M810 Age-related osteoporosis without current pathological fracture: Secondary | ICD-10-CM | POA: Diagnosis not present

## 2019-02-18 DIAGNOSIS — S92012D Displaced fracture of body of left calcaneus, subsequent encounter for fracture with routine healing: Secondary | ICD-10-CM | POA: Diagnosis not present

## 2019-02-20 ENCOUNTER — Telehealth: Payer: Self-pay | Admitting: *Deleted

## 2019-02-20 DIAGNOSIS — I10 Essential (primary) hypertension: Secondary | ICD-10-CM | POA: Diagnosis not present

## 2019-02-20 DIAGNOSIS — S92012D Displaced fracture of body of left calcaneus, subsequent encounter for fracture with routine healing: Secondary | ICD-10-CM | POA: Diagnosis not present

## 2019-02-20 DIAGNOSIS — L89626 Pressure-induced deep tissue damage of left heel: Secondary | ICD-10-CM | POA: Diagnosis not present

## 2019-02-20 DIAGNOSIS — Z452 Encounter for adjustment and management of vascular access device: Secondary | ICD-10-CM | POA: Diagnosis not present

## 2019-02-20 DIAGNOSIS — M810 Age-related osteoporosis without current pathological fracture: Secondary | ICD-10-CM | POA: Diagnosis not present

## 2019-02-20 DIAGNOSIS — A4902 Methicillin resistant Staphylococcus aureus infection, unspecified site: Secondary | ICD-10-CM | POA: Diagnosis not present

## 2019-02-20 NOTE — Telephone Encounter (Signed)
Home health nurse was told that patient could start bearing some weight.  [name] PT called requesting auth for an eval and instructions for weight nearing status

## 2019-02-20 NOTE — Telephone Encounter (Signed)
Approved.  

## 2019-02-23 ENCOUNTER — Encounter: Payer: Self-pay | Admitting: Internal Medicine

## 2019-02-23 DIAGNOSIS — I10 Essential (primary) hypertension: Secondary | ICD-10-CM | POA: Diagnosis not present

## 2019-02-23 DIAGNOSIS — M810 Age-related osteoporosis without current pathological fracture: Secondary | ICD-10-CM | POA: Diagnosis not present

## 2019-02-23 DIAGNOSIS — L89626 Pressure-induced deep tissue damage of left heel: Secondary | ICD-10-CM | POA: Diagnosis not present

## 2019-02-23 DIAGNOSIS — A4902 Methicillin resistant Staphylococcus aureus infection, unspecified site: Secondary | ICD-10-CM | POA: Diagnosis not present

## 2019-02-23 DIAGNOSIS — Z452 Encounter for adjustment and management of vascular access device: Secondary | ICD-10-CM | POA: Diagnosis not present

## 2019-02-23 DIAGNOSIS — Z79899 Other long term (current) drug therapy: Secondary | ICD-10-CM | POA: Diagnosis not present

## 2019-02-23 DIAGNOSIS — S92012D Displaced fracture of body of left calcaneus, subsequent encounter for fracture with routine healing: Secondary | ICD-10-CM | POA: Diagnosis not present

## 2019-02-24 ENCOUNTER — Ambulatory Visit (INDEPENDENT_AMBULATORY_CARE_PROVIDER_SITE_OTHER): Payer: Self-pay | Admitting: Podiatry

## 2019-02-24 ENCOUNTER — Other Ambulatory Visit: Payer: Self-pay | Admitting: Podiatry

## 2019-02-24 ENCOUNTER — Other Ambulatory Visit: Payer: Self-pay

## 2019-02-24 DIAGNOSIS — L97422 Non-pressure chronic ulcer of left heel and midfoot with fat layer exposed: Secondary | ICD-10-CM

## 2019-02-24 DIAGNOSIS — Z9889 Other specified postprocedural states: Secondary | ICD-10-CM

## 2019-02-24 NOTE — Progress Notes (Signed)
Subjective:  Patient ID: Jay Warren, male    DOB: 07-16-1933,  MRN: [id number]  Chief Complaint  Patient presents with  . Wound Check    F/U Lt heel wound check pt. states he's doing fine -pt denies any pain with WB -denies redness/swelling -pt states he has less drainage     DOS: 11/26/2018 Procedure: Open reduction percutaneous pinning of left calcaneus fracture  85 y.o. male returns for post-op check.  History as above. Denies new issues. Has been Weightbearing, denies issues.   Review of Systems: Negative except as noted in the HPI. Denies N/V/F/Ch.  Past Medical History:  Diagnosis Date  . BPH (benign prostatic hyperplasia)   . Brain cancer ([place])   . Hematuria   . Hyperlipidemia   . Hypertension   . Melkersson-Rosenthal syndrome   . MGUS (monoclonal gammopathy of unknown significance)   . Non-small cell lung cancer ([place])   . Osteoporosis   . Peripheral neuropathy   . Rosacea   . Seizure ([place])   . Vitamin D deficiency     Current Outpatient Medications:  .  alendronate (FOSAMAX) 70 MG tablet, Take 1 tablet (70 mg total) by mouth once a week. Take with a full glass of water on an empty stomach., Disp: 4 tablet, Rfl: 0 .  atorvastatin (LIPITOR) 40 MG tablet, , Disp: , Rfl:  .  bisoprolol-hydrochlorothiazide (ZIAC) 10-6.25 MG tablet, Take 1 tablet by mouth daily., Disp: 30 tablet, Rfl: 0 .  calcium carbonate (OS-CAL) 600 MG tablet, Take 2 tablets (1,200 mg total) by mouth daily., Disp: 60 tablet, Rfl: 0 .  Cholecalciferol (VITAMIN D3 MAXIMUM STRENGTH) 5000 units capsule, Take 1 capsule (5,000 Units total) by mouth daily., Disp: 30 capsule, Rfl: 0 .  collagenase (SANTYL) ointment, Apply 1 application topically daily. Left foot wound measurements: 0.5 x 0.5 x 0.1cm, 0.5 x 0.5 x 0.1cm, and 0.5 x 0.5 x 0.1cm., Disp: 30 g, Rfl: 5 .  Delafloxacin Meglumine (BAXDELA) 450 MG TABS, Take 1 tablet by mouth 2 (two) times daily., Disp: 14 tablet, Rfl: 0 .  furosemide (LASIX) 40 MG  tablet, , Disp: , Rfl:  .  gabapentin (NEURONTIN) 100 MG capsule, , Disp: , Rfl:  .  HYDROcodone-acetaminophen (NORCO) 5-325 MG tablet, Take 1 tablet by mouth every 6 (six) hours as needed for moderate pain., Disp: 12 tablet, Rfl: 0 .  HYDROcodone-acetaminophen (NORCO/VICODIN) 5-325 MG tablet, , Disp: , Rfl:  .  losartan (COZAAR) 50 MG tablet, Take 1 tablet (50 mg total) by mouth daily., Disp: 30 tablet, Rfl: 0 .  Multiple Vitamins-Minerals (MULTIVITAMIN) tablet, Take 1 tablet by mouth daily., Disp: 30 tablet, Rfl: 0 .  oxyCODONE-acetaminophen (PERCOCET) 10-325 MG tablet, , Disp: , Rfl:  .  oxyCODONE-acetaminophen (PERCOCET) 5-325 MG tablet, Take 1 tablet by mouth every 4 (four) hours as needed for severe pain., Disp: 20 tablet, Rfl: 0 .  phenytoin (DILANTIN) 100 MG ER capsule, Take 1 capsule (100 mg total) by mouth 3 (three) times daily., Disp: 90 capsule, Rfl: 0 .  phenytoin (DILANTIN) 100 MG ER capsule, , Disp: , Rfl:  .  simvastatin (ZOCOR) 80 MG tablet, Take 1 tablet (80 mg total) by mouth at bedtime., Disp: 30 tablet, Rfl: 0 .  tamsulosin (FLOMAX) 0.4 MG CAPS capsule, , Disp: , Rfl:  .  vancomycin (VANCOCIN) 10 G SOLR injection, , Disp: , Rfl:   Social History   Tobacco Use  Smoking Status Former Smoker  . Packs/day: 2.00  . Years: 30.00  .  Pack years: 60.00  . Types: Cigarettes  . Quit date: 06/04/1994  . Years since quitting: 24.7  Smokeless Tobacco Never Used    No Known Allergies Objective:   There were no vitals filed for this visit. There is no height or weight on file to calculate BMI. Constitutional Well developed. Well nourished.  Vascular Foot warm and well perfused. Capillary refill normal to all digits.   Neurologic Normal speech. Oriented to person, place, and time. Epicritic sensation to light touch grossly present bilaterally.  Dermatologic 3x2 with granular base wound left heel no probe to bone. No cellulitis, serous drainage only.  Orthopedic: No  tenderness to palpation noted about the surgical site. No pain with inversion/eversion of the heel. Pins intact   Radiographs: None Assessment:   1. Ulcer of heel, left, with fat layer exposed ([place])    Plan:  Patient was evaluated and treated and all questions answered.  S/p foot surgery left -Wound continues to improve. No cellulitis. -Continue WBAT in boot. -Continue Abx with ID input. On PICC -Wound debrided left heel. Covered under global/  No follow-ups on file.

## 2019-02-24 NOTE — Progress Notes (Signed)
Subjective:  Patient ID: Jay Warren, male    DOB: 13-Dec-1933,  MRN: [id number]  Chief Complaint  Patient presents with  . Routine Post Op     1 wk DOS 11/26/2018 ORIF CALCANEUS LT, pt states that he is feeling alot better since the last time he was here, pt also shows no signs of infection    DOS: 11/26/2018 Procedure: Open reduction percutaneous pinning of left calcaneus fracture  85 y.o. male returns for post-op check.  History as above.  Has maintained nonweightbearing.  Review of Systems: Negative except as noted in the HPI. Denies N/V/F/Ch.  Past Medical History:  Diagnosis Date  . BPH (benign prostatic hyperplasia)   . Brain cancer ([place])   . Hematuria   . Hyperlipidemia   . Hypertension   . Melkersson-Rosenthal syndrome   . MGUS (monoclonal gammopathy of unknown significance)   . Non-small cell lung cancer ([place])   . Osteoporosis   . Peripheral neuropathy   . Rosacea   . Seizure ([place])   . Vitamin D deficiency     Current Outpatient Medications:  .  alendronate (FOSAMAX) 70 MG tablet, Take 1 tablet (70 mg total) by mouth once a week. Take with a full glass of water on an empty stomach., Disp: 4 tablet, Rfl: 0 .  atorvastatin (LIPITOR) 40 MG tablet, , Disp: , Rfl:  .  bisoprolol-hydrochlorothiazide (ZIAC) 10-6.25 MG tablet, Take 1 tablet by mouth daily., Disp: 30 tablet, Rfl: 0 .  calcium carbonate (OS-CAL) 600 MG tablet, Take 2 tablets (1,200 mg total) by mouth daily., Disp: 60 tablet, Rfl: 0 .  Cholecalciferol (VITAMIN D3 MAXIMUM STRENGTH) 5000 units capsule, Take 1 capsule (5,000 Units total) by mouth daily., Disp: 30 capsule, Rfl: 0 .  collagenase (SANTYL) ointment, Apply 1 application topically daily. Left foot wound measurements: 0.5 x 0.5 x 0.1cm, 0.5 x 0.5 x 0.1cm, and 0.5 x 0.5 x 0.1cm., Disp: 30 g, Rfl: 5 .  Delafloxacin Meglumine (BAXDELA) 450 MG TABS, Take 1 tablet by mouth 2 (two) times daily., Disp: 14 tablet, Rfl: 0 .  furosemide (LASIX) 40 MG tablet, ,  Disp: , Rfl:  .  gabapentin (NEURONTIN) 100 MG capsule, , Disp: , Rfl:  .  HYDROcodone-acetaminophen (NORCO) 5-325 MG tablet, Take 1 tablet by mouth every 6 (six) hours as needed for moderate pain., Disp: 12 tablet, Rfl: 0 .  HYDROcodone-acetaminophen (NORCO/VICODIN) 5-325 MG tablet, , Disp: , Rfl:  .  losartan (COZAAR) 50 MG tablet, Take 1 tablet (50 mg total) by mouth daily., Disp: 30 tablet, Rfl: 0 .  Multiple Vitamins-Minerals (MULTIVITAMIN) tablet, Take 1 tablet by mouth daily., Disp: 30 tablet, Rfl: 0 .  oxyCODONE-acetaminophen (PERCOCET) 10-325 MG tablet, , Disp: , Rfl:  .  oxyCODONE-acetaminophen (PERCOCET) 5-325 MG tablet, Take 1 tablet by mouth every 4 (four) hours as needed for severe pain., Disp: 20 tablet, Rfl: 0 .  phenytoin (DILANTIN) 100 MG ER capsule, Take 1 capsule (100 mg total) by mouth 3 (three) times daily., Disp: 90 capsule, Rfl: 0 .  phenytoin (DILANTIN) 100 MG ER capsule, , Disp: , Rfl:  .  simvastatin (ZOCOR) 80 MG tablet, Take 1 tablet (80 mg total) by mouth at bedtime., Disp: 30 tablet, Rfl: 0 .  tamsulosin (FLOMAX) 0.4 MG CAPS capsule, , Disp: , Rfl:  .  vancomycin (VANCOCIN) 10 G SOLR injection, , Disp: , Rfl:   Social History   Tobacco Use  Smoking Status Former Smoker  . Packs/day: 2.00  . Years:  30.00  . Pack years: 60.00  . Types: Cigarettes  . Quit date: 06/04/1994  . Years since quitting: 24.7  Smokeless Tobacco Never Used    No Known Allergies Objective:   There were no vitals filed for this visit. There is no height or weight on file to calculate BMI. Constitutional Well developed. Well nourished.  Vascular Foot warm and well perfused. Capillary refill normal to all digits.   Neurologic Normal speech. Oriented to person, place, and time. Epicritic sensation to light touch grossly present bilaterally.  Dermatologic Wound 3x3 cm posterior heel without probe to bone no active cellulitis no signs of acute infection  Orthopedic: No tenderness to  palpation noted about the surgical site. No pain with inversion/eversion of the heel. Pins intact   Radiographs: None Assessment:   1. Ulcer of heel, left, with fat layer exposed ([place])    Plan:  Patient was evaluated and treated and all questions answered.  S/p foot surgery left -Wound improving.  No active cellulitis -Okay to start some weightbearing with the boot. -Continue Abx with ID input. On PICC line. -Wound debrided with 312 blade and tissue nipper.  Covered under global.  Santyl applied  No follow-ups on file.

## 2019-02-25 ENCOUNTER — Encounter: Payer: Self-pay | Admitting: Internal Medicine

## 2019-02-25 DIAGNOSIS — Z85841 Personal history of malignant neoplasm of brain: Secondary | ICD-10-CM | POA: Diagnosis not present

## 2019-02-25 DIAGNOSIS — E559 Vitamin D deficiency, unspecified: Secondary | ICD-10-CM | POA: Diagnosis not present

## 2019-02-25 DIAGNOSIS — Z85118 Personal history of other malignant neoplasm of bronchus and lung: Secondary | ICD-10-CM | POA: Diagnosis not present

## 2019-02-25 DIAGNOSIS — Z79891 Long term (current) use of opiate analgesic: Secondary | ICD-10-CM | POA: Diagnosis not present

## 2019-02-25 DIAGNOSIS — G6289 Other specified polyneuropathies: Secondary | ICD-10-CM | POA: Diagnosis not present

## 2019-02-25 DIAGNOSIS — S92012D Displaced fracture of body of left calcaneus, subsequent encounter for fracture with routine healing: Secondary | ICD-10-CM | POA: Diagnosis not present

## 2019-02-25 DIAGNOSIS — Z9181 History of falling: Secondary | ICD-10-CM | POA: Diagnosis not present

## 2019-02-25 DIAGNOSIS — Z452 Encounter for adjustment and management of vascular access device: Secondary | ICD-10-CM | POA: Diagnosis not present

## 2019-02-25 DIAGNOSIS — A4902 Methicillin resistant Staphylococcus aureus infection, unspecified site: Secondary | ICD-10-CM | POA: Diagnosis not present

## 2019-02-25 DIAGNOSIS — M810 Age-related osteoporosis without current pathological fracture: Secondary | ICD-10-CM | POA: Diagnosis not present

## 2019-02-25 DIAGNOSIS — L89626 Pressure-induced deep tissue damage of left heel: Secondary | ICD-10-CM | POA: Diagnosis not present

## 2019-02-25 DIAGNOSIS — I10 Essential (primary) hypertension: Secondary | ICD-10-CM | POA: Diagnosis not present

## 2019-02-25 DIAGNOSIS — Z79899 Other long term (current) drug therapy: Secondary | ICD-10-CM | POA: Diagnosis not present

## 2019-02-28 DIAGNOSIS — Z23 Encounter for immunization: Secondary | ICD-10-CM | POA: Diagnosis not present

## 2019-03-01 NOTE — Progress Notes (Signed)
Subjective:  Patient ID: Jay Warren, male    DOB: 1933/07/15,  MRN: [id number]  No chief complaint on file.   DOS: 11/26/2018 Procedure: Open reduction percutaneous pinning of left calcaneus fracture  85 y.o. male returns for post-op check.  States the area is doing better improved color decrease drainage muscle redness using Santyl and Cipro  Review of Systems: Negative except as noted in the HPI. Denies N/V/F/Ch.  Past Medical History:  Diagnosis Date  . BPH (benign prostatic hyperplasia)   . Brain cancer ([place])   . Hematuria   . Hyperlipidemia   . Hypertension   . Melkersson-Rosenthal syndrome   . MGUS (monoclonal gammopathy of unknown significance)   . Non-small cell lung cancer ([place])   . Osteoporosis   . Peripheral neuropathy   . Rosacea   . Seizure ([place])   . Vitamin D deficiency     Current Outpatient Medications:  .  alendronate (FOSAMAX) 70 MG tablet, Take 1 tablet (70 mg total) by mouth once a week. Take with a full glass of water on an empty stomach., Disp: 4 tablet, Rfl: 0 .  atorvastatin (LIPITOR) 40 MG tablet, , Disp: , Rfl:  .  bisoprolol-hydrochlorothiazide (ZIAC) 10-6.25 MG tablet, Take 1 tablet by mouth daily., Disp: 30 tablet, Rfl: 0 .  calcium carbonate (OS-CAL) 600 MG tablet, Take 2 tablets (1,200 mg total) by mouth daily., Disp: 60 tablet, Rfl: 0 .  Cholecalciferol (VITAMIN D3 MAXIMUM STRENGTH) 5000 units capsule, Take 1 capsule (5,000 Units total) by mouth daily., Disp: 30 capsule, Rfl: 0 .  collagenase (SANTYL) ointment, Apply 1 application topically daily. Left foot wound measurements: 0.5 x 0.5 x 0.1cm, 0.5 x 0.5 x 0.1cm, and 0.5 x 0.5 x 0.1cm., Disp: 30 g, Rfl: 5 .  Delafloxacin Meglumine (BAXDELA) 450 MG TABS, Take 1 tablet by mouth 2 (two) times daily., Disp: 14 tablet, Rfl: 0 .  furosemide (LASIX) 40 MG tablet, , Disp: , Rfl:  .  gabapentin (NEURONTIN) 100 MG capsule, , Disp: , Rfl:  .  HYDROcodone-acetaminophen (NORCO) 5-325 MG tablet, Take 1  tablet by mouth every 6 (six) hours as needed for moderate pain., Disp: 12 tablet, Rfl: 0 .  HYDROcodone-acetaminophen (NORCO/VICODIN) 5-325 MG tablet, , Disp: , Rfl:  .  losartan (COZAAR) 50 MG tablet, Take 1 tablet (50 mg total) by mouth daily., Disp: 30 tablet, Rfl: 0 .  Multiple Vitamins-Minerals (MULTIVITAMIN) tablet, Take 1 tablet by mouth daily., Disp: 30 tablet, Rfl: 0 .  oxyCODONE-acetaminophen (PERCOCET) 10-325 MG tablet, , Disp: , Rfl:  .  oxyCODONE-acetaminophen (PERCOCET) 5-325 MG tablet, Take 1 tablet by mouth every 4 (four) hours as needed for severe pain., Disp: 20 tablet, Rfl: 0 .  phenytoin (DILANTIN) 100 MG ER capsule, Take 1 capsule (100 mg total) by mouth 3 (three) times daily., Disp: 90 capsule, Rfl: 0 .  phenytoin (DILANTIN) 100 MG ER capsule, , Disp: , Rfl:  .  simvastatin (ZOCOR) 80 MG tablet, Take 1 tablet (80 mg total) by mouth at bedtime., Disp: 30 tablet, Rfl: 0 .  tamsulosin (FLOMAX) 0.4 MG CAPS capsule, , Disp: , Rfl:  .  vancomycin (VANCOCIN) 10 G SOLR injection, , Disp: , Rfl:   Social History   Tobacco Use  Smoking Status Former Smoker  . Packs/day: 2.00  . Years: 30.00  . Pack years: 60.00  . Types: Cigarettes  . Quit date: 06/04/1994  . Years since quitting: 24.7  Smokeless Tobacco Never Used    No Known  Allergies Objective:   There were no vitals filed for this visit. There is no height or weight on file to calculate BMI. Constitutional Well developed. Well nourished.  Vascular Foot warm and well perfused. Capillary refill normal to all digits.   Neurologic Normal speech. Oriented to person, place, and time. Epicritic sensation to light touch grossly present bilaterally.  Dermatologic Pins digging into skin with periwound cellulitis.  Plantar heel eschar resolved now with small posteriormedial superficial ulcer measuring 0.5cm in diameter.  Orthopedic: Mild tenderness to palpation noted about the surgical site. No pain with inversion/eversion  of the heel. Pins intact   Radiographs: Taken and reviewed pins intact fracture appears healed but fracture displacement noted. Assessment:   1. Ulcer of heel, left, limited to breakdown of skin [facility])    Plan:  Patient was evaluated and treated and all questions answered.  S/p foot surgery left -Reviewed culture results with patient recommend patient follow-up with ID due to Pseudomonas and Staphylococcus with some resistance -Referral placed to ID follow-up next week for recheck  Heel Wound left -Continue santyl WTD  No follow-ups on file.

## 2019-03-02 ENCOUNTER — Encounter: Payer: Self-pay | Admitting: Internal Medicine

## 2019-03-02 DIAGNOSIS — A4902 Methicillin resistant Staphylococcus aureus infection, unspecified site: Secondary | ICD-10-CM | POA: Diagnosis not present

## 2019-03-02 DIAGNOSIS — Z79899 Other long term (current) drug therapy: Secondary | ICD-10-CM | POA: Diagnosis not present

## 2019-03-04 ENCOUNTER — Encounter: Payer: Self-pay | Admitting: Internal Medicine

## 2019-03-04 DIAGNOSIS — A4902 Methicillin resistant Staphylococcus aureus infection, unspecified site: Secondary | ICD-10-CM | POA: Diagnosis not present

## 2019-03-04 DIAGNOSIS — Z79899 Other long term (current) drug therapy: Secondary | ICD-10-CM | POA: Diagnosis not present

## 2019-03-09 DIAGNOSIS — A4902 Methicillin resistant Staphylococcus aureus infection, unspecified site: Secondary | ICD-10-CM | POA: Diagnosis not present

## 2019-03-09 DIAGNOSIS — Z79899 Other long term (current) drug therapy: Secondary | ICD-10-CM | POA: Diagnosis not present

## 2019-03-10 ENCOUNTER — Other Ambulatory Visit: Payer: Self-pay

## 2019-03-10 ENCOUNTER — Ambulatory Visit (INDEPENDENT_AMBULATORY_CARE_PROVIDER_SITE_OTHER): Payer: Medicare Other | Admitting: Podiatry

## 2019-03-10 DIAGNOSIS — L97422 Non-pressure chronic ulcer of left heel and midfoot with fat layer exposed: Secondary | ICD-10-CM

## 2019-03-11 ENCOUNTER — Ambulatory Visit (INDEPENDENT_AMBULATORY_CARE_PROVIDER_SITE_OTHER): Payer: Medicare Other | Admitting: Internal Medicine

## 2019-03-11 ENCOUNTER — Telehealth: Payer: Self-pay | Admitting: *Deleted

## 2019-03-11 ENCOUNTER — Encounter: Payer: Self-pay | Admitting: Internal Medicine

## 2019-03-11 VITALS — BP 173/73 | HR 75 | Temp 97.4°F

## 2019-03-11 DIAGNOSIS — Z5181 Encounter for therapeutic drug level monitoring: Secondary | ICD-10-CM

## 2019-03-11 DIAGNOSIS — Z452 Encounter for adjustment and management of vascular access device: Secondary | ICD-10-CM

## 2019-03-11 DIAGNOSIS — M86172 Other acute osteomyelitis, left ankle and foot: Secondary | ICD-10-CM

## 2019-03-11 NOTE — Progress Notes (Signed)
Per verbal order from Dr Linus Salmons, 40 cm Single Lumen Peripherally Inserted Central Catheter removed from right basilic, tip intact. No sutures present. RN confirmed length per chart. Dressing was clean and dry. Petroleum dressing applied. Pt advised no heavy lifting with this arm, leave dressing for 24 hours and call the office or seek emergent care if dressing becomes soaked with blood or swelling or sharp pain presents. Patient and his son verbalized understanding and agreement.  Patient's questions answered to their satisfaction. Patient tolerated procedure well.  RN notified [facility] pharmacy and Cassie Kuppelweiser. Landis Gandy, RN

## 2019-03-11 NOTE — Telephone Encounter (Signed)
Ebony Hail with [facility] called stating that the patient has some how lost the santyl and wanted to know if they can switch to Aquacel AG with twice weekly dressing changes.  Please advise.

## 2019-03-12 NOTE — Assessment & Plan Note (Signed)
Good healing and no concerns on exam. He has completed 6 weeks of treatment and inflammatory markers also reassuring.   Will stop vancomycin

## 2019-03-12 NOTE — Progress Notes (Signed)
Patient ID: Jay Warren, male   DOB: 04/26/1934, 85 y.o.   MRN: [id number]   Subjective:    Patient ID: Jay Warren, male    DOB: [birth year], 85 y.o.   MRN: [id number]  HPI Here for follow up of osteomyelitis.  Developed a stress fracture and underwent pinning but had to be removed due to infection with drainage.  Pins removed out of bone and culture with MRSA.  Healing fracture.  I started him on IV vancomycin with presumed osteomyelitis based on pin/site culture.   His heal is improved with no drainage and no new concerns per the patient and son.  He has no associated rash or diarrhea.  No issues with picc line.  Labs have remained stable.  No complaints today   Review of Systems  Constitutional: Negative for fatigue and fever.  Gastrointestinal: Negative for diarrhea and nausea.  Genitourinary: Negative for difficulty urinating.  Skin: Negative for rash.       Objective:   Physical Exam Constitutional:      Appearance: Normal appearance.  Eyes:     General: No scleral icterus. Musculoskeletal:     Comments: Wound with no surrounding erythema, no drainange.  Good tissue at base and does not probe to bone.   Neurological:     Mental Status: He is alert.   SH: no tobacco        Assessment & Plan:

## 2019-03-12 NOTE — Assessment & Plan Note (Signed)
Creat and vancomycin levels monitored and no issues.

## 2019-03-12 NOTE — Telephone Encounter (Signed)
Ok please inform

## 2019-03-12 NOTE — Assessment & Plan Note (Signed)
Has been working well and no issues.  As above, now stopping antibiotics and picc line removed in clinic.

## 2019-03-27 DIAGNOSIS — Z85118 Personal history of other malignant neoplasm of bronchus and lung: Secondary | ICD-10-CM | POA: Diagnosis not present

## 2019-03-27 DIAGNOSIS — Z79891 Long term (current) use of opiate analgesic: Secondary | ICD-10-CM | POA: Diagnosis not present

## 2019-03-27 DIAGNOSIS — Z9181 History of falling: Secondary | ICD-10-CM | POA: Diagnosis not present

## 2019-03-27 DIAGNOSIS — M810 Age-related osteoporosis without current pathological fracture: Secondary | ICD-10-CM | POA: Diagnosis not present

## 2019-03-27 DIAGNOSIS — Z79899 Other long term (current) drug therapy: Secondary | ICD-10-CM | POA: Diagnosis not present

## 2019-03-27 DIAGNOSIS — S92012D Displaced fracture of body of left calcaneus, subsequent encounter for fracture with routine healing: Secondary | ICD-10-CM | POA: Diagnosis not present

## 2019-03-27 DIAGNOSIS — I10 Essential (primary) hypertension: Secondary | ICD-10-CM | POA: Diagnosis not present

## 2019-03-27 DIAGNOSIS — G6289 Other specified polyneuropathies: Secondary | ICD-10-CM | POA: Diagnosis not present

## 2019-03-27 DIAGNOSIS — A4902 Methicillin resistant Staphylococcus aureus infection, unspecified site: Secondary | ICD-10-CM | POA: Diagnosis not present

## 2019-03-27 DIAGNOSIS — Z85841 Personal history of malignant neoplasm of brain: Secondary | ICD-10-CM | POA: Diagnosis not present

## 2019-03-27 DIAGNOSIS — Z452 Encounter for adjustment and management of vascular access device: Secondary | ICD-10-CM | POA: Diagnosis not present

## 2019-03-27 DIAGNOSIS — E559 Vitamin D deficiency, unspecified: Secondary | ICD-10-CM | POA: Diagnosis not present

## 2019-03-27 DIAGNOSIS — L89626 Pressure-induced deep tissue damage of left heel: Secondary | ICD-10-CM | POA: Diagnosis not present

## 2019-03-30 DIAGNOSIS — Z452 Encounter for adjustment and management of vascular access device: Secondary | ICD-10-CM | POA: Diagnosis not present

## 2019-03-30 DIAGNOSIS — L89626 Pressure-induced deep tissue damage of left heel: Secondary | ICD-10-CM | POA: Diagnosis not present

## 2019-03-30 DIAGNOSIS — S92012D Displaced fracture of body of left calcaneus, subsequent encounter for fracture with routine healing: Secondary | ICD-10-CM | POA: Diagnosis not present

## 2019-03-30 DIAGNOSIS — A4902 Methicillin resistant Staphylococcus aureus infection, unspecified site: Secondary | ICD-10-CM | POA: Diagnosis not present

## 2019-03-30 DIAGNOSIS — M810 Age-related osteoporosis without current pathological fracture: Secondary | ICD-10-CM | POA: Diagnosis not present

## 2019-03-30 DIAGNOSIS — I10 Essential (primary) hypertension: Secondary | ICD-10-CM | POA: Diagnosis not present

## 2019-03-31 ENCOUNTER — Other Ambulatory Visit: Payer: Self-pay

## 2019-03-31 ENCOUNTER — Ambulatory Visit (INDEPENDENT_AMBULATORY_CARE_PROVIDER_SITE_OTHER): Payer: Medicare Other | Admitting: Podiatry

## 2019-03-31 DIAGNOSIS — L928 Other granulomatous disorders of the skin and subcutaneous tissue: Secondary | ICD-10-CM | POA: Diagnosis not present

## 2019-03-31 DIAGNOSIS — L97422 Non-pressure chronic ulcer of left heel and midfoot with fat layer exposed: Secondary | ICD-10-CM | POA: Diagnosis not present

## 2019-03-31 NOTE — Progress Notes (Signed)
Subjective:  Patient ID: Jay Warren, male    DOB: Oct 26, 1933,  MRN: [id number]  Chief Complaint  Patient presents with  . Wound Check    F?U Lt heel ulcer Pt. states," doing alright, healing well, only sore when I try to walk a little." Tx: silver patch and dressing -w/ redness at top of foot x 1 wk ago " it stings" -picc line came out 2 wks ago -pt denies N/V/F/Ch     DOS: 11/26/2018 Procedure: Open reduction percutaneous pinning of left calcaneus fracture  85 y.o. male returns for post-op check.  History as above. Wound improving. Only minimal pain with walking.  Review of Systems: Negative except as noted in the HPI. Denies N/V/F/Ch.  Past Medical History:  Diagnosis Date  . BPH (benign prostatic hyperplasia)   . Brain cancer ([place])   . Hematuria   . Hyperlipidemia   . Hypertension   . Melkersson-Rosenthal syndrome   . MGUS (monoclonal gammopathy of unknown significance)   . Non-small cell lung cancer ([place])   . Osteoporosis   . Peripheral neuropathy   . Rosacea   . Seizure ([place])   . Vitamin D deficiency     Current Outpatient Medications:  .  alendronate (FOSAMAX) 70 MG tablet, Take 1 tablet (70 mg total) by mouth once a week. Take with a full glass of water on an empty stomach., Disp: 4 tablet, Rfl: 0 .  atorvastatin (LIPITOR) 40 MG tablet, , Disp: , Rfl:  .  bisoprolol-hydrochlorothiazide (ZIAC) 10-6.25 MG tablet, Take 1 tablet by mouth daily., Disp: 30 tablet, Rfl: 0 .  calcium carbonate (OS-CAL) 600 MG tablet, Take 2 tablets (1,200 mg total) by mouth daily., Disp: 60 tablet, Rfl: 0 .  Cholecalciferol (VITAMIN D3 MAXIMUM STRENGTH) 5000 units capsule, Take 1 capsule (5,000 Units total) by mouth daily., Disp: 30 capsule, Rfl: 0 .  collagenase (SANTYL) ointment, Apply 1 application topically daily. Left foot wound measurements: 0.5 x 0.5 x 0.1cm, 0.5 x 0.5 x 0.1cm, and 0.5 x 0.5 x 0.1cm., Disp: 30 g, Rfl: 5 .  Delafloxacin Meglumine (BAXDELA) 450 MG TABS, Take 1 tablet by  mouth 2 (two) times daily., Disp: 14 tablet, Rfl: 0 .  furosemide (LASIX) 40 MG tablet, , Disp: , Rfl:  .  gabapentin (NEURONTIN) 100 MG capsule, , Disp: , Rfl:  .  HYDROcodone-acetaminophen (NORCO) 5-325 MG tablet, Take 1 tablet by mouth every 6 (six) hours as needed for moderate pain., Disp: 12 tablet, Rfl: 0 .  HYDROcodone-acetaminophen (NORCO/VICODIN) 5-325 MG tablet, , Disp: , Rfl:  .  losartan (COZAAR) 50 MG tablet, Take 1 tablet (50 mg total) by mouth daily., Disp: 30 tablet, Rfl: 0 .  Multiple Vitamins-Minerals (MULTIVITAMIN) tablet, Take 1 tablet by mouth daily., Disp: 30 tablet, Rfl: 0 .  oxyCODONE-acetaminophen (PERCOCET) 10-325 MG tablet, , Disp: , Rfl:  .  oxyCODONE-acetaminophen (PERCOCET) 5-325 MG tablet, Take 1 tablet by mouth every 4 (four) hours as needed for severe pain., Disp: 20 tablet, Rfl: 0 .  phenytoin (DILANTIN) 100 MG ER capsule, Take 1 capsule (100 mg total) by mouth 3 (three) times daily., Disp: 90 capsule, Rfl: 0 .  phenytoin (DILANTIN) 100 MG ER capsule, , Disp: , Rfl:  .  simvastatin (ZOCOR) 80 MG tablet, Take 1 tablet (80 mg total) by mouth at bedtime., Disp: 30 tablet, Rfl: 0 .  tamsulosin (FLOMAX) 0.4 MG CAPS capsule, , Disp: , Rfl:  .  vancomycin (VANCOCIN) 10 G SOLR injection, , Disp: , Rfl:  Social History   Tobacco Use  Smoking Status Former Smoker  . Packs/day: 2.00  . Years: 30.00  . Pack years: 60.00  . Types: Cigarettes  . Quit date: 06/04/1994  . Years since quitting: 24.8  Smokeless Tobacco Never Used    No Known Allergies Objective:   There were no vitals filed for this visit. There is no height or weight on file to calculate BMI. Constitutional Well developed. Well nourished.  Vascular Foot warm and well perfused. Capillary refill normal to all digits.   Neurologic Normal speech. Oriented to person, place, and time. Epicritic sensation to light touch grossly present bilaterally.  Dermatologic Wound 2x15. Posterior heel with  granular base, no warmth erythema signs of infeciton. Dorsal abrasion 2/2 bandaging.  Orthopedic: No tenderness to palpation noted about the surgical site. No pain with inversion/eversion of the heel.   Radiographs: None Assessment:   1. Other granulomatous disorders of the skin and subcutaneous tissue   2. Ulcer of heel, left, with fat layer exposed ([place])    Plan:  Patient was evaluated and treated and all questions answered.  Ulcer left heel -No debridement today -Silver nitrate applied -F/u in 2 weeks for recheck -Continue dressing with HHC. -Encouraged continued walking wit hCAM Boot assist.  Return in about 2 weeks (around 04/14/2019).

## 2019-04-02 DIAGNOSIS — S92012D Displaced fracture of body of left calcaneus, subsequent encounter for fracture with routine healing: Secondary | ICD-10-CM | POA: Diagnosis not present

## 2019-04-02 DIAGNOSIS — A4902 Methicillin resistant Staphylococcus aureus infection, unspecified site: Secondary | ICD-10-CM | POA: Diagnosis not present

## 2019-04-02 DIAGNOSIS — M810 Age-related osteoporosis without current pathological fracture: Secondary | ICD-10-CM | POA: Diagnosis not present

## 2019-04-02 DIAGNOSIS — Z452 Encounter for adjustment and management of vascular access device: Secondary | ICD-10-CM | POA: Diagnosis not present

## 2019-04-02 DIAGNOSIS — L89626 Pressure-induced deep tissue damage of left heel: Secondary | ICD-10-CM | POA: Diagnosis not present

## 2019-04-02 DIAGNOSIS — I10 Essential (primary) hypertension: Secondary | ICD-10-CM | POA: Diagnosis not present

## 2019-04-03 DIAGNOSIS — I10 Essential (primary) hypertension: Secondary | ICD-10-CM | POA: Diagnosis not present

## 2019-04-03 DIAGNOSIS — L89626 Pressure-induced deep tissue damage of left heel: Secondary | ICD-10-CM | POA: Diagnosis not present

## 2019-04-03 DIAGNOSIS — M810 Age-related osteoporosis without current pathological fracture: Secondary | ICD-10-CM | POA: Diagnosis not present

## 2019-04-03 DIAGNOSIS — S92012D Displaced fracture of body of left calcaneus, subsequent encounter for fracture with routine healing: Secondary | ICD-10-CM | POA: Diagnosis not present

## 2019-04-03 DIAGNOSIS — Z452 Encounter for adjustment and management of vascular access device: Secondary | ICD-10-CM | POA: Diagnosis not present

## 2019-04-03 DIAGNOSIS — A4902 Methicillin resistant Staphylococcus aureus infection, unspecified site: Secondary | ICD-10-CM | POA: Diagnosis not present

## 2019-04-04 NOTE — Progress Notes (Signed)
Subjective:  Patient ID: Jay Warren, male    DOB: 1934/01/25,  MRN: [id number]  Chief Complaint  Patient presents with  . Wound Check    F/U Lt heel ulcer check Pt. denies any new issues/concerns -pt states it's doing good, healing up good." Tx: santly -pt denies N/V/F/Ch     DOS: 11/26/2018 Procedure: Open reduction percutaneous pinning of left calcaneus fracture  85 y.o. male returns for post-op check.  History as above. Denies new issues.   Review of Systems: Negative except as noted in the HPI. Denies N/V/F/Ch.  Past Medical History:  Diagnosis Date  . BPH (benign prostatic hyperplasia)   . Brain cancer ([place])   . Hematuria   . Hyperlipidemia   . Hypertension   . Melkersson-Rosenthal syndrome   . MGUS (monoclonal gammopathy of unknown significance)   . Non-small cell lung cancer ([place])   . Osteoporosis   . Peripheral neuropathy   . Rosacea   . Seizure ([place])   . Vitamin D deficiency     Current Outpatient Medications:  .  alendronate (FOSAMAX) 70 MG tablet, Take 1 tablet (70 mg total) by mouth once a week. Take with a full glass of water on an empty stomach., Disp: 4 tablet, Rfl: 0 .  atorvastatin (LIPITOR) 40 MG tablet, , Disp: , Rfl:  .  bisoprolol-hydrochlorothiazide (ZIAC) 10-6.25 MG tablet, Take 1 tablet by mouth daily., Disp: 30 tablet, Rfl: 0 .  calcium carbonate (OS-CAL) 600 MG tablet, Take 2 tablets (1,200 mg total) by mouth daily., Disp: 60 tablet, Rfl: 0 .  cephALEXin (KEFLEX) 500 MG capsule, Take 500 mg by mouth 3 (three) times daily., Disp: , Rfl:  .  Cholecalciferol (VITAMIN D3 MAXIMUM STRENGTH) 5000 units capsule, Take 1 capsule (5,000 Units total) by mouth daily., Disp: 30 capsule, Rfl: 0 .  clindamycin (CLEOCIN) 150 MG capsule, 2 times a day., Disp: 14 capsule, Rfl: 0 .  collagenase (SANTYL) ointment, Apply 1 application topically daily. Left foot wound measurements: 0.5 x 0.5 x 0.1cm, 0.5 x 0.5 x 0.1cm, and 0.5 x 0.5 x 0.1cm., Disp: 30 g, Rfl: 5 .   Delafloxacin Meglumine (BAXDELA) 450 MG TABS, Take 1 tablet by mouth 2 (two) times daily., Disp: 14 tablet, Rfl: 0 .  doxycycline (VIBRA-TABS) 100 MG tablet, Take 1 tablet (100 mg total) by mouth 2 (two) times daily., Disp: 20 tablet, Rfl: 0 .  furosemide (LASIX) 40 MG tablet, , Disp: , Rfl:  .  gabapentin (NEURONTIN) 100 MG capsule, , Disp: , Rfl:  .  HYDROcodone-acetaminophen (NORCO) 5-325 MG tablet, Take 1 tablet by mouth every 6 (six) hours as needed for moderate pain., Disp: 12 tablet, Rfl: 0 .  HYDROcodone-acetaminophen (NORCO/VICODIN) 5-325 MG tablet, , Disp: , Rfl:  .  losartan (COZAAR) 50 MG tablet, Take 1 tablet (50 mg total) by mouth daily., Disp: 30 tablet, Rfl: 0 .  Multiple Vitamins-Minerals (MULTIVITAMIN) tablet, Take 1 tablet by mouth daily., Disp: 30 tablet, Rfl: 0 .  mupirocin ointment (BACTROBAN) 2 %, , Disp: , Rfl:  .  oxyCODONE-acetaminophen (PERCOCET) 10-325 MG tablet, , Disp: , Rfl:  .  oxyCODONE-acetaminophen (PERCOCET) 5-325 MG tablet, Take 1 tablet by mouth every 4 (four) hours as needed for severe pain., Disp: 20 tablet, Rfl: 0 .  phenytoin (DILANTIN) 100 MG ER capsule, Take 1 capsule (100 mg total) by mouth 3 (three) times daily., Disp: 90 capsule, Rfl: 0 .  phenytoin (DILANTIN) 100 MG ER capsule, , Disp: , Rfl:  .  silver sulfADIAZINE (SILVADENE) 1 % cream, Apply pea-sized amount to wound daily., Disp: 50 g, Rfl: 0 .  simvastatin (ZOCOR) 80 MG tablet, Take 1 tablet (80 mg total) by mouth at bedtime., Disp: 30 tablet, Rfl: 0 .  tamsulosin (FLOMAX) 0.4 MG CAPS capsule, , Disp: , Rfl:  .  vancomycin (VANCOCIN) 10 G SOLR injection, , Disp: , Rfl:   Social History   Tobacco Use  Smoking Status Former Smoker  . Packs/day: 2.00  . Years: 30.00  . Pack years: 60.00  . Types: Cigarettes  . Quit date: 06/04/1994  . Years since quitting: 25.5  Smokeless Tobacco Never Used    No Known Allergies Objective:   There were no vitals filed for this visit. There is no  height or weight on file to calculate BMI. Constitutional Well developed. Well nourished.  Vascular Foot warm and well perfused. Capillary refill normal to all digits.   Neurologic Normal speech. Oriented to person, place, and time. Epicritic sensation to light touch grossly present bilaterally.  Dermatologic 2x2 with granular base wound left heel no probe to bone. No cellulitis, serous drainage only.  Orthopedic: No tenderness to palpation noted about the surgical site. No pain with inversion/eversion of the heel. Pins intact   Radiographs: None Assessment:   1. Ulcer of heel, left, with fat layer exposed ([place])    Plan:  Patient was evaluated and treated and all questions answered.  S/p foot surgery left -Wound continues to improve. No cellulitis. -Continue WBAT in boot. -Continue Abx -Wound debrided left heel  No follow-ups on file.

## 2019-04-06 DIAGNOSIS — S92012D Displaced fracture of body of left calcaneus, subsequent encounter for fracture with routine healing: Secondary | ICD-10-CM | POA: Diagnosis not present

## 2019-04-06 DIAGNOSIS — M810 Age-related osteoporosis without current pathological fracture: Secondary | ICD-10-CM | POA: Diagnosis not present

## 2019-04-06 DIAGNOSIS — L89626 Pressure-induced deep tissue damage of left heel: Secondary | ICD-10-CM | POA: Diagnosis not present

## 2019-04-06 DIAGNOSIS — A4902 Methicillin resistant Staphylococcus aureus infection, unspecified site: Secondary | ICD-10-CM | POA: Diagnosis not present

## 2019-04-06 DIAGNOSIS — I10 Essential (primary) hypertension: Secondary | ICD-10-CM | POA: Diagnosis not present

## 2019-04-06 DIAGNOSIS — Z452 Encounter for adjustment and management of vascular access device: Secondary | ICD-10-CM | POA: Diagnosis not present

## 2019-04-09 DIAGNOSIS — Z452 Encounter for adjustment and management of vascular access device: Secondary | ICD-10-CM | POA: Diagnosis not present

## 2019-04-09 DIAGNOSIS — S92012D Displaced fracture of body of left calcaneus, subsequent encounter for fracture with routine healing: Secondary | ICD-10-CM | POA: Diagnosis not present

## 2019-04-09 DIAGNOSIS — I10 Essential (primary) hypertension: Secondary | ICD-10-CM | POA: Diagnosis not present

## 2019-04-09 DIAGNOSIS — L89626 Pressure-induced deep tissue damage of left heel: Secondary | ICD-10-CM | POA: Diagnosis not present

## 2019-04-09 DIAGNOSIS — A4902 Methicillin resistant Staphylococcus aureus infection, unspecified site: Secondary | ICD-10-CM | POA: Diagnosis not present

## 2019-04-09 DIAGNOSIS — M810 Age-related osteoporosis without current pathological fracture: Secondary | ICD-10-CM | POA: Diagnosis not present

## 2019-04-13 DIAGNOSIS — S92012D Displaced fracture of body of left calcaneus, subsequent encounter for fracture with routine healing: Secondary | ICD-10-CM | POA: Diagnosis not present

## 2019-04-13 DIAGNOSIS — Z452 Encounter for adjustment and management of vascular access device: Secondary | ICD-10-CM | POA: Diagnosis not present

## 2019-04-13 DIAGNOSIS — M810 Age-related osteoporosis without current pathological fracture: Secondary | ICD-10-CM | POA: Diagnosis not present

## 2019-04-13 DIAGNOSIS — I10 Essential (primary) hypertension: Secondary | ICD-10-CM | POA: Diagnosis not present

## 2019-04-13 DIAGNOSIS — A4902 Methicillin resistant Staphylococcus aureus infection, unspecified site: Secondary | ICD-10-CM | POA: Diagnosis not present

## 2019-04-13 DIAGNOSIS — L89626 Pressure-induced deep tissue damage of left heel: Secondary | ICD-10-CM | POA: Diagnosis not present

## 2019-04-14 ENCOUNTER — Ambulatory Visit (INDEPENDENT_AMBULATORY_CARE_PROVIDER_SITE_OTHER): Payer: Medicare Other | Admitting: Podiatry

## 2019-04-14 ENCOUNTER — Other Ambulatory Visit: Payer: Self-pay

## 2019-04-14 DIAGNOSIS — L97421 Non-pressure chronic ulcer of left heel and midfoot limited to breakdown of skin: Secondary | ICD-10-CM

## 2019-04-15 DIAGNOSIS — I10 Essential (primary) hypertension: Secondary | ICD-10-CM | POA: Diagnosis not present

## 2019-04-15 DIAGNOSIS — A4902 Methicillin resistant Staphylococcus aureus infection, unspecified site: Secondary | ICD-10-CM | POA: Diagnosis not present

## 2019-04-15 DIAGNOSIS — Z452 Encounter for adjustment and management of vascular access device: Secondary | ICD-10-CM | POA: Diagnosis not present

## 2019-04-15 DIAGNOSIS — M810 Age-related osteoporosis without current pathological fracture: Secondary | ICD-10-CM | POA: Diagnosis not present

## 2019-04-15 DIAGNOSIS — L89626 Pressure-induced deep tissue damage of left heel: Secondary | ICD-10-CM | POA: Diagnosis not present

## 2019-04-15 DIAGNOSIS — S92012D Displaced fracture of body of left calcaneus, subsequent encounter for fracture with routine healing: Secondary | ICD-10-CM | POA: Diagnosis not present

## 2019-04-16 DIAGNOSIS — Z452 Encounter for adjustment and management of vascular access device: Secondary | ICD-10-CM | POA: Diagnosis not present

## 2019-04-16 DIAGNOSIS — I10 Essential (primary) hypertension: Secondary | ICD-10-CM | POA: Diagnosis not present

## 2019-04-16 DIAGNOSIS — M810 Age-related osteoporosis without current pathological fracture: Secondary | ICD-10-CM | POA: Diagnosis not present

## 2019-04-16 DIAGNOSIS — A4902 Methicillin resistant Staphylococcus aureus infection, unspecified site: Secondary | ICD-10-CM | POA: Diagnosis not present

## 2019-04-16 DIAGNOSIS — L89626 Pressure-induced deep tissue damage of left heel: Secondary | ICD-10-CM | POA: Diagnosis not present

## 2019-04-16 DIAGNOSIS — S92012D Displaced fracture of body of left calcaneus, subsequent encounter for fracture with routine healing: Secondary | ICD-10-CM | POA: Diagnosis not present

## 2019-04-20 DIAGNOSIS — S92012D Displaced fracture of body of left calcaneus, subsequent encounter for fracture with routine healing: Secondary | ICD-10-CM | POA: Diagnosis not present

## 2019-04-20 DIAGNOSIS — L89626 Pressure-induced deep tissue damage of left heel: Secondary | ICD-10-CM | POA: Diagnosis not present

## 2019-04-20 DIAGNOSIS — I10 Essential (primary) hypertension: Secondary | ICD-10-CM | POA: Diagnosis not present

## 2019-04-20 DIAGNOSIS — A4902 Methicillin resistant Staphylococcus aureus infection, unspecified site: Secondary | ICD-10-CM | POA: Diagnosis not present

## 2019-04-20 DIAGNOSIS — M810 Age-related osteoporosis without current pathological fracture: Secondary | ICD-10-CM | POA: Diagnosis not present

## 2019-04-20 DIAGNOSIS — Z452 Encounter for adjustment and management of vascular access device: Secondary | ICD-10-CM | POA: Diagnosis not present

## 2019-04-23 DIAGNOSIS — S92012D Displaced fracture of body of left calcaneus, subsequent encounter for fracture with routine healing: Secondary | ICD-10-CM | POA: Diagnosis not present

## 2019-04-23 DIAGNOSIS — I10 Essential (primary) hypertension: Secondary | ICD-10-CM | POA: Diagnosis not present

## 2019-04-23 DIAGNOSIS — A4902 Methicillin resistant Staphylococcus aureus infection, unspecified site: Secondary | ICD-10-CM | POA: Diagnosis not present

## 2019-04-23 DIAGNOSIS — L89626 Pressure-induced deep tissue damage of left heel: Secondary | ICD-10-CM | POA: Diagnosis not present

## 2019-04-23 DIAGNOSIS — M810 Age-related osteoporosis without current pathological fracture: Secondary | ICD-10-CM | POA: Diagnosis not present

## 2019-04-23 DIAGNOSIS — Z452 Encounter for adjustment and management of vascular access device: Secondary | ICD-10-CM | POA: Diagnosis not present

## 2019-04-26 DIAGNOSIS — Z9181 History of falling: Secondary | ICD-10-CM | POA: Diagnosis not present

## 2019-04-26 DIAGNOSIS — Z79899 Other long term (current) drug therapy: Secondary | ICD-10-CM | POA: Diagnosis not present

## 2019-04-26 DIAGNOSIS — I10 Essential (primary) hypertension: Secondary | ICD-10-CM | POA: Diagnosis not present

## 2019-04-26 DIAGNOSIS — S92012D Displaced fracture of body of left calcaneus, subsequent encounter for fracture with routine healing: Secondary | ICD-10-CM | POA: Diagnosis not present

## 2019-04-26 DIAGNOSIS — Z452 Encounter for adjustment and management of vascular access device: Secondary | ICD-10-CM | POA: Diagnosis not present

## 2019-04-26 DIAGNOSIS — Z79891 Long term (current) use of opiate analgesic: Secondary | ICD-10-CM | POA: Diagnosis not present

## 2019-04-26 DIAGNOSIS — L89626 Pressure-induced deep tissue damage of left heel: Secondary | ICD-10-CM | POA: Diagnosis not present

## 2019-04-26 DIAGNOSIS — Z85118 Personal history of other malignant neoplasm of bronchus and lung: Secondary | ICD-10-CM | POA: Diagnosis not present

## 2019-04-26 DIAGNOSIS — G6289 Other specified polyneuropathies: Secondary | ICD-10-CM | POA: Diagnosis not present

## 2019-04-26 DIAGNOSIS — E559 Vitamin D deficiency, unspecified: Secondary | ICD-10-CM | POA: Diagnosis not present

## 2019-04-26 DIAGNOSIS — M810 Age-related osteoporosis without current pathological fracture: Secondary | ICD-10-CM | POA: Diagnosis not present

## 2019-04-26 DIAGNOSIS — Z85841 Personal history of malignant neoplasm of brain: Secondary | ICD-10-CM | POA: Diagnosis not present

## 2019-04-26 DIAGNOSIS — A4902 Methicillin resistant Staphylococcus aureus infection, unspecified site: Secondary | ICD-10-CM | POA: Diagnosis not present

## 2019-04-27 DIAGNOSIS — A4902 Methicillin resistant Staphylococcus aureus infection, unspecified site: Secondary | ICD-10-CM | POA: Diagnosis not present

## 2019-04-27 DIAGNOSIS — I10 Essential (primary) hypertension: Secondary | ICD-10-CM | POA: Diagnosis not present

## 2019-04-27 DIAGNOSIS — M810 Age-related osteoporosis without current pathological fracture: Secondary | ICD-10-CM | POA: Diagnosis not present

## 2019-04-27 DIAGNOSIS — Z452 Encounter for adjustment and management of vascular access device: Secondary | ICD-10-CM | POA: Diagnosis not present

## 2019-04-27 DIAGNOSIS — S92012D Displaced fracture of body of left calcaneus, subsequent encounter for fracture with routine healing: Secondary | ICD-10-CM | POA: Diagnosis not present

## 2019-04-27 DIAGNOSIS — L89626 Pressure-induced deep tissue damage of left heel: Secondary | ICD-10-CM | POA: Diagnosis not present

## 2019-05-05 ENCOUNTER — Ambulatory Visit (INDEPENDENT_AMBULATORY_CARE_PROVIDER_SITE_OTHER): Payer: Medicare Other | Admitting: Podiatry

## 2019-05-05 ENCOUNTER — Other Ambulatory Visit: Payer: Self-pay

## 2019-05-05 DIAGNOSIS — H6123 Impacted cerumen, bilateral: Secondary | ICD-10-CM | POA: Diagnosis not present

## 2019-05-05 DIAGNOSIS — C3491 Malignant neoplasm of unspecified part of right bronchus or lung: Secondary | ICD-10-CM | POA: Diagnosis not present

## 2019-05-05 DIAGNOSIS — G40909 Epilepsy, unspecified, not intractable, without status epilepticus: Secondary | ICD-10-CM | POA: Diagnosis not present

## 2019-05-05 DIAGNOSIS — E785 Hyperlipidemia, unspecified: Secondary | ICD-10-CM | POA: Diagnosis not present

## 2019-05-05 DIAGNOSIS — L97421 Non-pressure chronic ulcer of left heel and midfoot limited to breakdown of skin: Secondary | ICD-10-CM

## 2019-05-05 DIAGNOSIS — I10 Essential (primary) hypertension: Secondary | ICD-10-CM | POA: Diagnosis not present

## 2019-05-05 DIAGNOSIS — E559 Vitamin D deficiency, unspecified: Secondary | ICD-10-CM | POA: Diagnosis not present

## 2019-05-05 DIAGNOSIS — Z79899 Other long term (current) drug therapy: Secondary | ICD-10-CM | POA: Diagnosis not present

## 2019-05-05 DIAGNOSIS — Z85118 Personal history of other malignant neoplasm of bronchus and lung: Secondary | ICD-10-CM | POA: Diagnosis not present

## 2019-05-05 NOTE — Progress Notes (Signed)
Subjective:  Patient ID: Jay Warren, male    DOB: [birth year],  MRN: [id number]  Chief Complaint  Patient presents with  . Foot Ulcer    F?U Lt heel ulcer Pt. states," doin fine." -pt denies swelling -better with draiange and redness -w/ min. pian; 1/10 when stepping -pt denies N/V/F/Ch Tx: boot, silver nitrate dressing     DOS: 11/26/2018 Procedure: Open reduction percutaneous pinning of left calcaneus fracture  85 y.o. male returns for post-op check.  History as above. Wound continues to improve.  Review of Systems: Negative except as noted in the HPI. Denies N/V/F/Ch.  Past Medical History:  Diagnosis Date  . BPH (benign prostatic hyperplasia)   . Brain cancer ([place])   . Hematuria   . Hyperlipidemia   . Hypertension   . Melkersson-Rosenthal syndrome   . MGUS (monoclonal gammopathy of unknown significance)   . Non-small cell lung cancer ([place])   . Osteoporosis   . Peripheral neuropathy   . Rosacea   . Seizure ([place])   . Vitamin D deficiency     Current Outpatient Medications:  .  alendronate (FOSAMAX) 70 MG tablet, Take 1 tablet (70 mg total) by mouth once a week. Take with a full glass of water on an empty stomach., Disp: 4 tablet, Rfl: 0 .  atorvastatin (LIPITOR) 40 MG tablet, , Disp: , Rfl:  .  bisoprolol-hydrochlorothiazide (ZIAC) 10-6.25 MG tablet, Take 1 tablet by mouth daily., Disp: 30 tablet, Rfl: 0 .  calcium carbonate (OS-CAL) 600 MG tablet, Take 2 tablets (1,200 mg total) by mouth daily., Disp: 60 tablet, Rfl: 0 .  Cholecalciferol (VITAMIN D3 MAXIMUM STRENGTH) 5000 units capsule, Take 1 capsule (5,000 Units total) by mouth daily., Disp: 30 capsule, Rfl: 0 .  collagenase (SANTYL) ointment, Apply 1 application topically daily. Left foot wound measurements: 0.5 x 0.5 x 0.1cm, 0.5 x 0.5 x 0.1cm, and 0.5 x 0.5 x 0.1cm., Disp: 30 g, Rfl: 5 .  Delafloxacin Meglumine (BAXDELA) 450 MG TABS, Take 1 tablet by mouth 2 (two) times daily., Disp: 14 tablet, Rfl: 0 .  furosemide  (LASIX) 40 MG tablet, , Disp: , Rfl:  .  gabapentin (NEURONTIN) 100 MG capsule, , Disp: , Rfl:  .  HYDROcodone-acetaminophen (NORCO) 5-325 MG tablet, Take 1 tablet by mouth every 6 (six) hours as needed for moderate pain., Disp: 12 tablet, Rfl: 0 .  HYDROcodone-acetaminophen (NORCO/VICODIN) 5-325 MG tablet, , Disp: , Rfl:  .  losartan (COZAAR) 50 MG tablet, Take 1 tablet (50 mg total) by mouth daily., Disp: 30 tablet, Rfl: 0 .  Multiple Vitamins-Minerals (MULTIVITAMIN) tablet, Take 1 tablet by mouth daily., Disp: 30 tablet, Rfl: 0 .  oxyCODONE-acetaminophen (PERCOCET) 10-325 MG tablet, , Disp: , Rfl:  .  oxyCODONE-acetaminophen (PERCOCET) 5-325 MG tablet, Take 1 tablet by mouth every 4 (four) hours as needed for severe pain., Disp: 20 tablet, Rfl: 0 .  phenytoin (DILANTIN) 100 MG ER capsule, Take 1 capsule (100 mg total) by mouth 3 (three) times daily., Disp: 90 capsule, Rfl: 0 .  phenytoin (DILANTIN) 100 MG ER capsule, , Disp: , Rfl:  .  simvastatin (ZOCOR) 80 MG tablet, Take 1 tablet (80 mg total) by mouth at bedtime., Disp: 30 tablet, Rfl: 0 .  tamsulosin (FLOMAX) 0.4 MG CAPS capsule, , Disp: , Rfl:  .  vancomycin (VANCOCIN) 10 G SOLR injection, , Disp: , Rfl:   Social History   Tobacco Use  Smoking Status Former Smoker  . Packs/day: 2.00  . Years:  30.00  . Pack years: 60.00  . Types: Cigarettes  . Quit date: 06/04/1994  . Years since quitting: 24.9  Smokeless Tobacco Never Used    No Known Allergies Objective:   There were no vitals filed for this visit. There is no height or weight on file to calculate BMI. Constitutional Well developed. Well nourished.  Vascular Foot warm and well perfused. Capillary refill normal to all digits.   Neurologic Normal speech. Oriented to person, place, and time. Epicritic sensation to light touch grossly present bilaterally.  Dermatologic Wound 1x2. Posterior heel with granular base, no warmth erythema signs of infeciton.  Orthopedic: No  tenderness to palpation noted about the surgical site. No pain with inversion/eversion of the heel.   Radiographs: None Assessment:   1. Ulcer of heel, left, limited to breakdown of skin [facility])    Plan:  Patient was evaluated and treated and all questions answered.  Ulcer left heel -Silver nitrate again applied, followed by foam dressing. -Continues to heal, albeit slowly -Patient can progressively put more weight on the foot with boot assist -F/u in 3 weeks for recheck.  No follow-ups on file.

## 2019-05-07 ENCOUNTER — Telehealth: Payer: Self-pay | Admitting: *Deleted

## 2019-05-07 NOTE — Telephone Encounter (Signed)
I called [facility] and informed of Dr. Eleanora Neighbor 05/07/2019 2:40pm orders 3 x week.

## 2019-05-07 NOTE — Telephone Encounter (Signed)
[facility] states Dr. March Rummage saw pt yesterday and she was calling for wound care orders, since pt was sent home with a bandaid over the wound site, and if needed she can discharge pt from [facility].

## 2019-05-07 NOTE — Telephone Encounter (Signed)
Silvadene ointment and foam border dressing. No kerlix wrap

## 2019-05-11 ENCOUNTER — Telehealth: Payer: Self-pay

## 2019-05-11 NOTE — Telephone Encounter (Signed)
Jay Warren from [facility] stating that she has wound orders for Jay Warren to use silvadene cream, Nurse states the ointment was not called in to the Patient's pharmacy. Nurse questions  if there's a different wound order or if the silvadene cream will be called in. Thanks

## 2019-05-12 MED ORDER — SILVER SULFADIAZINE 1 % EX CREA: TOPICAL_CREAM | CUTANEOUS | 0 refills | Status: AC

## 2019-05-12 NOTE — Addendum Note (Signed)
Addended by: Hardie Pulley on: 05/12/2019 04:47 PM   Modules accepted: Orders

## 2019-05-13 ENCOUNTER — Telehealth: Payer: Self-pay

## 2019-05-13 NOTE — Telephone Encounter (Signed)
Pt notified of the prescription sent to his pharmacy

## 2019-05-22 ENCOUNTER — Telehealth: Payer: Self-pay

## 2019-05-22 NOTE — Telephone Encounter (Signed)
Crystal from [facility] called stating Jay Warren new diabetic shoes are causing him to trip frequently. Nurse also stated she needs a verbal order  To see Pt once a week starting Sunday for the next 8-9 weeks.  Nurse also, pointed out that the Pt when the Pt walks 120 ft or exercise OZ drops to 88 after one minute and then it goes back up to 90, and exertion rate is 7/10.  Please advice

## 2019-05-26 ENCOUNTER — Telehealth: Payer: Self-pay | Admitting: Urology

## 2019-05-26 DIAGNOSIS — Z85841 Personal history of malignant neoplasm of brain: Secondary | ICD-10-CM | POA: Diagnosis not present

## 2019-05-26 DIAGNOSIS — S92012D Displaced fracture of body of left calcaneus, subsequent encounter for fracture with routine healing: Secondary | ICD-10-CM | POA: Diagnosis not present

## 2019-05-26 DIAGNOSIS — Z85118 Personal history of other malignant neoplasm of bronchus and lung: Secondary | ICD-10-CM | POA: Diagnosis not present

## 2019-05-26 DIAGNOSIS — G6289 Other specified polyneuropathies: Secondary | ICD-10-CM | POA: Diagnosis not present

## 2019-05-26 DIAGNOSIS — Z79891 Long term (current) use of opiate analgesic: Secondary | ICD-10-CM | POA: Diagnosis not present

## 2019-05-26 DIAGNOSIS — M810 Age-related osteoporosis without current pathological fracture: Secondary | ICD-10-CM | POA: Diagnosis not present

## 2019-05-26 DIAGNOSIS — I10 Essential (primary) hypertension: Secondary | ICD-10-CM | POA: Diagnosis not present

## 2019-05-26 DIAGNOSIS — E559 Vitamin D deficiency, unspecified: Secondary | ICD-10-CM | POA: Diagnosis not present

## 2019-05-26 DIAGNOSIS — Z9181 History of falling: Secondary | ICD-10-CM | POA: Diagnosis not present

## 2019-05-26 DIAGNOSIS — L89623 Pressure ulcer of left heel, stage 3: Secondary | ICD-10-CM | POA: Diagnosis not present

## 2019-05-26 DIAGNOSIS — Z79899 Other long term (current) drug therapy: Secondary | ICD-10-CM | POA: Diagnosis not present

## 2019-05-26 NOTE — Telephone Encounter (Signed)
I called Jay Warren and gave her the verbal order! Thanks

## 2019-05-26 NOTE — Telephone Encounter (Signed)
Nurse with [facility] Needs a verbal order  To see Pt once a week starting today for the next 8-9 weeks for PT. Please advise. Krista's # [phone]. Thanks

## 2019-05-26 NOTE — Telephone Encounter (Signed)
Ok to give verbal 

## 2019-06-01 DIAGNOSIS — E559 Vitamin D deficiency, unspecified: Secondary | ICD-10-CM | POA: Diagnosis not present

## 2019-06-01 DIAGNOSIS — L89623 Pressure ulcer of left heel, stage 3: Secondary | ICD-10-CM | POA: Diagnosis not present

## 2019-06-01 DIAGNOSIS — G6289 Other specified polyneuropathies: Secondary | ICD-10-CM | POA: Diagnosis not present

## 2019-06-01 DIAGNOSIS — M810 Age-related osteoporosis without current pathological fracture: Secondary | ICD-10-CM | POA: Diagnosis not present

## 2019-06-01 DIAGNOSIS — S92012D Displaced fracture of body of left calcaneus, subsequent encounter for fracture with routine healing: Secondary | ICD-10-CM | POA: Diagnosis not present

## 2019-06-01 DIAGNOSIS — I10 Essential (primary) hypertension: Secondary | ICD-10-CM | POA: Diagnosis not present

## 2019-06-03 DIAGNOSIS — G6289 Other specified polyneuropathies: Secondary | ICD-10-CM | POA: Diagnosis not present

## 2019-06-03 DIAGNOSIS — S92012D Displaced fracture of body of left calcaneus, subsequent encounter for fracture with routine healing: Secondary | ICD-10-CM | POA: Diagnosis not present

## 2019-06-03 DIAGNOSIS — L89623 Pressure ulcer of left heel, stage 3: Secondary | ICD-10-CM | POA: Diagnosis not present

## 2019-06-03 DIAGNOSIS — E559 Vitamin D deficiency, unspecified: Secondary | ICD-10-CM | POA: Diagnosis not present

## 2019-06-03 DIAGNOSIS — I10 Essential (primary) hypertension: Secondary | ICD-10-CM | POA: Diagnosis not present

## 2019-06-03 DIAGNOSIS — M810 Age-related osteoporosis without current pathological fracture: Secondary | ICD-10-CM | POA: Diagnosis not present

## 2019-06-07 NOTE — Progress Notes (Signed)
Subjective:  Patient ID: Jay Warren, male    DOB: 1934/04/02,  MRN: [id number]  Chief Complaint  Patient presents with  . Foot Ulcer    F/U Lt heelu ulcer-w/ an increase of redness at top of foot, numbness and tingling and burning -pt states probably dressing is too tight Tx: silver nitrate and dressing by [facility] -pt states ulcer is doing pretty good    DOS: 11/26/2018 Procedure: Open reduction percutaneous pinning of left calcaneus fracture  86 y.o. male returns for post-op check.  History as above. Wound improving but with some dressing irritation.  Review of Systems: Negative except as noted in the HPI. Denies N/V/F/Ch.  Past Medical History:  Diagnosis Date  . BPH (benign prostatic hyperplasia)   . Brain cancer ([place])   . Hematuria   . Hyperlipidemia   . Hypertension   . Melkersson-Rosenthal syndrome   . MGUS (monoclonal gammopathy of unknown significance)   . Non-small cell lung cancer ([place])   . Osteoporosis   . Peripheral neuropathy   . Rosacea   . Seizure ([place])   . Vitamin D deficiency     Current Outpatient Medications:  .  alendronate (FOSAMAX) 70 MG tablet, Take 1 tablet (70 mg total) by mouth once a week. Take with a full glass of water on an empty stomach., Disp: 4 tablet, Rfl: 0 .  atorvastatin (LIPITOR) 40 MG tablet, , Disp: , Rfl:  .  bisoprolol-hydrochlorothiazide (ZIAC) 10-6.25 MG tablet, Take 1 tablet by mouth daily., Disp: 30 tablet, Rfl: 0 .  calcium carbonate (OS-CAL) 600 MG tablet, Take 2 tablets (1,200 mg total) by mouth daily., Disp: 60 tablet, Rfl: 0 .  Cholecalciferol (VITAMIN D3 MAXIMUM STRENGTH) 5000 units capsule, Take 1 capsule (5,000 Units total) by mouth daily., Disp: 30 capsule, Rfl: 0 .  collagenase (SANTYL) ointment, Apply 1 application topically daily. Left foot wound measurements: 0.5 x 0.5 x 0.1cm, 0.5 x 0.5 x 0.1cm, and 0.5 x 0.5 x 0.1cm., Disp: 30 g, Rfl: 5 .  Delafloxacin Meglumine (BAXDELA) 450 MG TABS, Take 1 tablet by mouth 2 (two)  times daily., Disp: 14 tablet, Rfl: 0 .  furosemide (LASIX) 40 MG tablet, , Disp: , Rfl:  .  gabapentin (NEURONTIN) 100 MG capsule, , Disp: , Rfl:  .  HYDROcodone-acetaminophen (NORCO) 5-325 MG tablet, Take 1 tablet by mouth every 6 (six) hours as needed for moderate pain., Disp: 12 tablet, Rfl: 0 .  HYDROcodone-acetaminophen (NORCO/VICODIN) 5-325 MG tablet, , Disp: , Rfl:  .  losartan (COZAAR) 50 MG tablet, Take 1 tablet (50 mg total) by mouth daily., Disp: 30 tablet, Rfl: 0 .  Multiple Vitamins-Minerals (MULTIVITAMIN) tablet, Take 1 tablet by mouth daily., Disp: 30 tablet, Rfl: 0 .  oxyCODONE-acetaminophen (PERCOCET) 10-325 MG tablet, , Disp: , Rfl:  .  oxyCODONE-acetaminophen (PERCOCET) 5-325 MG tablet, Take 1 tablet by mouth every 4 (four) hours as needed for severe pain., Disp: 20 tablet, Rfl: 0 .  phenytoin (DILANTIN) 100 MG ER capsule, Take 1 capsule (100 mg total) by mouth 3 (three) times daily., Disp: 90 capsule, Rfl: 0 .  phenytoin (DILANTIN) 100 MG ER capsule, , Disp: , Rfl:  .  silver sulfADIAZINE (SILVADENE) 1 % cream, Apply pea-sized amount to wound daily., Disp: 50 g, Rfl: 0 .  simvastatin (ZOCOR) 80 MG tablet, Take 1 tablet (80 mg total) by mouth at bedtime., Disp: 30 tablet, Rfl: 0 .  tamsulosin (FLOMAX) 0.4 MG CAPS capsule, , Disp: , Rfl:  .  vancomycin (  VANCOCIN) 10 G SOLR injection, , Disp: , Rfl:   Social History   Tobacco Use  Smoking Status Former Smoker  . Packs/day: 2.00  . Years: 30.00  . Pack years: 60.00  . Types: Cigarettes  . Quit date: 06/04/1994  . Years since quitting: 25.0  Smokeless Tobacco Never Used    No Known Allergies Objective:   There were no vitals filed for this visit. There is no height or weight on file to calculate BMI. Constitutional Well developed. Well nourished.  Vascular Foot warm and well perfused. Capillary refill normal to all digits.   Neurologic Normal speech. Oriented to person, place, and time. Epicritic sensation to  light touch grossly present bilaterally.  Dermatologic  wound almost fully epithelialized but with dorsal skin irritation from tight bandaging  Orthopedic: No tenderness to palpation noted about the surgical site. No pain with inversion/eversion of the heel.   Radiographs: None Assessment:   1. Ulcer of heel, left, limited to breakdown of skin [facility])    Plan:  Patient was evaluated and treated and all questions answered.  Ulcer left heel -Foam pad dressing applied avoid wrapping due to start of pressure irritation -Progressively put more weight on the wound -Dispensed surgical shoe  No follow-ups on file.

## 2019-06-08 DIAGNOSIS — L89623 Pressure ulcer of left heel, stage 3: Secondary | ICD-10-CM | POA: Diagnosis not present

## 2019-06-08 DIAGNOSIS — I10 Essential (primary) hypertension: Secondary | ICD-10-CM | POA: Diagnosis not present

## 2019-06-08 DIAGNOSIS — M810 Age-related osteoporosis without current pathological fracture: Secondary | ICD-10-CM | POA: Diagnosis not present

## 2019-06-08 DIAGNOSIS — G6289 Other specified polyneuropathies: Secondary | ICD-10-CM | POA: Diagnosis not present

## 2019-06-08 DIAGNOSIS — E559 Vitamin D deficiency, unspecified: Secondary | ICD-10-CM | POA: Diagnosis not present

## 2019-06-08 DIAGNOSIS — S92012D Displaced fracture of body of left calcaneus, subsequent encounter for fracture with routine healing: Secondary | ICD-10-CM | POA: Diagnosis not present

## 2019-06-09 ENCOUNTER — Other Ambulatory Visit: Payer: Self-pay

## 2019-06-09 ENCOUNTER — Ambulatory Visit (INDEPENDENT_AMBULATORY_CARE_PROVIDER_SITE_OTHER): Payer: Medicare Other | Admitting: Podiatry

## 2019-06-09 DIAGNOSIS — L97421 Non-pressure chronic ulcer of left heel and midfoot limited to breakdown of skin: Secondary | ICD-10-CM | POA: Diagnosis not present

## 2019-06-09 DIAGNOSIS — S92012S Displaced fracture of body of left calcaneus, sequela: Secondary | ICD-10-CM

## 2019-06-09 DIAGNOSIS — L89626 Pressure-induced deep tissue damage of left heel: Secondary | ICD-10-CM | POA: Diagnosis not present

## 2019-06-09 NOTE — Progress Notes (Signed)
  Subjective:  Patient ID: [name], male    DOB: Apr 23, 1934,  MRN: [id number]  Chief Complaint  Patient presents with  . Foot Ulcer    F/.U Lt ulcer heel Pt. states," doing good." -pt denies redness/swellign/draiange Tx: sx shoe and foam pad dressing     86 y.o. male presents with the above complaint. History confirmed with patient.   Objective:  Physical Exam: Left Foot: no pain at the heel. Ulcer appears epithelialized.  No warmth no erythema no signs of acute infection. Small hyperkeratosis.    Assessment:   1. Ulcer of heel, left, limited to breakdown of skin ([place])   2. Closed displaced fracture of body of left calcaneus, sequela   3. Pressure injury of deep tissue of left heel      Plan:  Patient was evaluated and treated and all questions answered.  Ulcer left heel, Hx Fracture left calcaneus -Trial transition to normal shoe gear -Applied Betadine due to maceration and foam dressing. -Discussed due to prominence of the bone lesion is likely to recur. -He is not having any pain and I think he is safe to progress weightbearing.  Use walker for assist  No follow-ups on file.   MDM Number of Diagnoses or Management Options   Amount and/or Complexity of Data Reviewed Decide to obtain previous medical records or to obtain history from someone other than the patient: yes  Risk of Complications, Morbidity, and/or Mortality Presenting problems: high Management options: moderate  Patient Progress Patient progress: improved

## 2019-06-10 DIAGNOSIS — G6289 Other specified polyneuropathies: Secondary | ICD-10-CM | POA: Diagnosis not present

## 2019-06-10 DIAGNOSIS — E559 Vitamin D deficiency, unspecified: Secondary | ICD-10-CM | POA: Diagnosis not present

## 2019-06-10 DIAGNOSIS — M810 Age-related osteoporosis without current pathological fracture: Secondary | ICD-10-CM | POA: Diagnosis not present

## 2019-06-10 DIAGNOSIS — L89623 Pressure ulcer of left heel, stage 3: Secondary | ICD-10-CM | POA: Diagnosis not present

## 2019-06-10 DIAGNOSIS — S92012D Displaced fracture of body of left calcaneus, subsequent encounter for fracture with routine healing: Secondary | ICD-10-CM | POA: Diagnosis not present

## 2019-06-10 DIAGNOSIS — I10 Essential (primary) hypertension: Secondary | ICD-10-CM | POA: Diagnosis not present

## 2019-06-11 ENCOUNTER — Telehealth: Payer: Self-pay

## 2019-06-11 NOTE — Telephone Encounter (Signed)
Can discharge patient from care

## 2019-06-11 NOTE — Telephone Encounter (Signed)
Jay Warren From [facility] would like to know further wound care instructions or discharge the Pt. Please advice

## 2019-06-12 ENCOUNTER — Telehealth: Payer: Self-pay

## 2019-06-12 NOTE — Telephone Encounter (Signed)
Called and LVM to Jay Warren form [facility] about the Dr's instructions to discharge Pt from care

## 2019-06-15 DIAGNOSIS — G6289 Other specified polyneuropathies: Secondary | ICD-10-CM | POA: Diagnosis not present

## 2019-06-15 DIAGNOSIS — E559 Vitamin D deficiency, unspecified: Secondary | ICD-10-CM | POA: Diagnosis not present

## 2019-06-15 DIAGNOSIS — L89623 Pressure ulcer of left heel, stage 3: Secondary | ICD-10-CM | POA: Diagnosis not present

## 2019-06-15 DIAGNOSIS — S92012D Displaced fracture of body of left calcaneus, subsequent encounter for fracture with routine healing: Secondary | ICD-10-CM | POA: Diagnosis not present

## 2019-06-15 DIAGNOSIS — M810 Age-related osteoporosis without current pathological fracture: Secondary | ICD-10-CM | POA: Diagnosis not present

## 2019-06-15 DIAGNOSIS — I10 Essential (primary) hypertension: Secondary | ICD-10-CM | POA: Diagnosis not present

## 2019-06-17 ENCOUNTER — Telehealth: Payer: Self-pay

## 2019-06-17 DIAGNOSIS — E559 Vitamin D deficiency, unspecified: Secondary | ICD-10-CM | POA: Diagnosis not present

## 2019-06-17 DIAGNOSIS — M810 Age-related osteoporosis without current pathological fracture: Secondary | ICD-10-CM | POA: Diagnosis not present

## 2019-06-17 DIAGNOSIS — S92012D Displaced fracture of body of left calcaneus, subsequent encounter for fracture with routine healing: Secondary | ICD-10-CM | POA: Diagnosis not present

## 2019-06-17 DIAGNOSIS — G6289 Other specified polyneuropathies: Secondary | ICD-10-CM | POA: Diagnosis not present

## 2019-06-17 DIAGNOSIS — I10 Essential (primary) hypertension: Secondary | ICD-10-CM | POA: Diagnosis not present

## 2019-06-17 DIAGNOSIS — L89623 Pressure ulcer of left heel, stage 3: Secondary | ICD-10-CM | POA: Diagnosis not present

## 2019-06-17 NOTE — Telephone Encounter (Signed)
Ok for verbal 

## 2019-06-17 NOTE — Telephone Encounter (Signed)
Jay Warren from [facility] called stating Jay Warren is back to regular tennis shoe and would like verbal order for resistance with ankle strengthening. Please advice

## 2019-06-18 NOTE — Telephone Encounter (Signed)
Called and spoke with Jay Warren from [facility] PT for verbal approval to start doing ROM, ankle strengthening.Marland KitchenMarland Kitchen

## 2019-06-25 DIAGNOSIS — Z23 Encounter for immunization: Secondary | ICD-10-CM | POA: Diagnosis not present

## 2019-06-25 DIAGNOSIS — I10 Essential (primary) hypertension: Secondary | ICD-10-CM | POA: Diagnosis not present

## 2019-06-25 DIAGNOSIS — S92012D Displaced fracture of body of left calcaneus, subsequent encounter for fracture with routine healing: Secondary | ICD-10-CM | POA: Diagnosis not present

## 2019-06-25 DIAGNOSIS — Z85841 Personal history of malignant neoplasm of brain: Secondary | ICD-10-CM | POA: Diagnosis not present

## 2019-06-25 DIAGNOSIS — M810 Age-related osteoporosis without current pathological fracture: Secondary | ICD-10-CM | POA: Diagnosis not present

## 2019-06-25 DIAGNOSIS — G6289 Other specified polyneuropathies: Secondary | ICD-10-CM | POA: Diagnosis not present

## 2019-06-25 DIAGNOSIS — Z79899 Other long term (current) drug therapy: Secondary | ICD-10-CM | POA: Diagnosis not present

## 2019-06-25 DIAGNOSIS — Z85118 Personal history of other malignant neoplasm of bronchus and lung: Secondary | ICD-10-CM | POA: Diagnosis not present

## 2019-06-25 DIAGNOSIS — L89623 Pressure ulcer of left heel, stage 3: Secondary | ICD-10-CM | POA: Diagnosis not present

## 2019-06-25 DIAGNOSIS — E559 Vitamin D deficiency, unspecified: Secondary | ICD-10-CM | POA: Diagnosis not present

## 2019-06-25 DIAGNOSIS — Z9181 History of falling: Secondary | ICD-10-CM | POA: Diagnosis not present

## 2019-06-25 DIAGNOSIS — Z79891 Long term (current) use of opiate analgesic: Secondary | ICD-10-CM | POA: Diagnosis not present

## 2019-06-26 DIAGNOSIS — L578 Other skin changes due to chronic exposure to nonionizing radiation: Secondary | ICD-10-CM | POA: Diagnosis not present

## 2019-06-26 DIAGNOSIS — C44529 Squamous cell carcinoma of skin of other part of trunk: Secondary | ICD-10-CM | POA: Diagnosis not present

## 2019-06-26 DIAGNOSIS — C4441 Basal cell carcinoma of skin of scalp and neck: Secondary | ICD-10-CM | POA: Diagnosis not present

## 2019-06-26 DIAGNOSIS — L821 Other seborrheic keratosis: Secondary | ICD-10-CM | POA: Diagnosis not present

## 2019-06-26 DIAGNOSIS — L570 Actinic keratosis: Secondary | ICD-10-CM | POA: Diagnosis not present

## 2019-06-29 ENCOUNTER — Encounter: Payer: Self-pay | Admitting: Podiatry

## 2019-06-29 ENCOUNTER — Telehealth: Payer: Self-pay

## 2019-06-29 NOTE — Telephone Encounter (Signed)
We can write a note excusing him due to inability of prolonged standing, inability to sit for long periods without elevating his foot.

## 2019-06-29 NOTE — Telephone Encounter (Signed)
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 Please advice

## 2019-06-29 NOTE — Telephone Encounter (Signed)
Note to [employer] made by [employer].  Pt was called back and advised to pick up note at any time. Pt. Stated understanding.

## 2019-07-02 DIAGNOSIS — G6289 Other specified polyneuropathies: Secondary | ICD-10-CM | POA: Diagnosis not present

## 2019-07-02 DIAGNOSIS — M810 Age-related osteoporosis without current pathological fracture: Secondary | ICD-10-CM | POA: Diagnosis not present

## 2019-07-02 DIAGNOSIS — E559 Vitamin D deficiency, unspecified: Secondary | ICD-10-CM | POA: Diagnosis not present

## 2019-07-02 DIAGNOSIS — S92012D Displaced fracture of body of left calcaneus, subsequent encounter for fracture with routine healing: Secondary | ICD-10-CM | POA: Diagnosis not present

## 2019-07-02 DIAGNOSIS — L89623 Pressure ulcer of left heel, stage 3: Secondary | ICD-10-CM | POA: Diagnosis not present

## 2019-07-02 DIAGNOSIS — I10 Essential (primary) hypertension: Secondary | ICD-10-CM | POA: Diagnosis not present

## 2019-07-08 DIAGNOSIS — I10 Essential (primary) hypertension: Secondary | ICD-10-CM | POA: Diagnosis not present

## 2019-07-08 DIAGNOSIS — E559 Vitamin D deficiency, unspecified: Secondary | ICD-10-CM | POA: Diagnosis not present

## 2019-07-08 DIAGNOSIS — S92012D Displaced fracture of body of left calcaneus, subsequent encounter for fracture with routine healing: Secondary | ICD-10-CM | POA: Diagnosis not present

## 2019-07-08 DIAGNOSIS — G6289 Other specified polyneuropathies: Secondary | ICD-10-CM | POA: Diagnosis not present

## 2019-07-08 DIAGNOSIS — L89623 Pressure ulcer of left heel, stage 3: Secondary | ICD-10-CM | POA: Diagnosis not present

## 2019-07-08 DIAGNOSIS — M810 Age-related osteoporosis without current pathological fracture: Secondary | ICD-10-CM | POA: Diagnosis not present

## 2019-07-09 DIAGNOSIS — C44519 Basal cell carcinoma of skin of other part of trunk: Secondary | ICD-10-CM | POA: Diagnosis not present

## 2019-07-09 DIAGNOSIS — C4441 Basal cell carcinoma of skin of scalp and neck: Secondary | ICD-10-CM | POA: Diagnosis not present

## 2019-07-14 ENCOUNTER — Ambulatory Visit (INDEPENDENT_AMBULATORY_CARE_PROVIDER_SITE_OTHER): Payer: Medicare Other | Admitting: Podiatry

## 2019-07-14 ENCOUNTER — Telehealth: Payer: Self-pay | Admitting: *Deleted

## 2019-07-14 ENCOUNTER — Other Ambulatory Visit: Payer: Self-pay

## 2019-07-14 DIAGNOSIS — L909 Atrophic disorder of skin, unspecified: Secondary | ICD-10-CM

## 2019-07-14 DIAGNOSIS — L97421 Non-pressure chronic ulcer of left heel and midfoot limited to breakdown of skin: Secondary | ICD-10-CM

## 2019-07-14 DIAGNOSIS — S92012S Displaced fracture of body of left calcaneus, sequela: Secondary | ICD-10-CM | POA: Diagnosis not present

## 2019-07-14 NOTE — Progress Notes (Signed)
  Subjective:  Patient ID: Jay Warren, male    DOB: 08-14-1933,  MRN: [id number]  Chief Complaint  Patient presents with  . Foot Ulcer    F/U Lt heel ulcer Pt. states," w/ occasional pain or getting worse. Not sure if healing well.' -w/ swellgin -w/ more bloody driaange (not a lot) -Tx: bandage    86 y.o. male presents for wound care. Hx confirmed with patient.  Objective:  Physical Exam: Wound Location: medial heel left Wound Measurement: 0.5x1 Wound Base: Mixed Granular/Fibrotic Peri-wound: Macerated Exudate: Scant/small amount Serosanguinous exudate wound without warmth, erythema, signs of acute infection Wound with prominent peri-wound bone  Assessment:   1. Ulcer of heel, left, limited to breakdown of skin ([place])   2. Fat pad atrophy of foot   3. Closed displaced fracture of body of left calcaneus, sequela      Plan:  Patient was evaluated and treated and all questions answered.  Ulcer left heel -Dressing applied consisting of medihoney -Order HHC for assistance with dressing changes. -Offload ulcer with surgical shoe -Wound cleansed and debrided -Discussed that due to prominent bone he would benefit from fat graft application. Patient and son verbalized understanding and wish to proceed. -Patient has failed all conservative therapy and wishes to proceed with surgical intervention. All risks, benefits, and alternatives discussed with patient. No guarantees given. Consent reviewed and signed by patient. -Planned procedures: debridement left heel wound, injection of fat allograft  Procedure: Selective Debridement of Wound Rationale: Removal of devitalized tissue from the wound to promote healing.  Pre-Debridement Wound Measurements: 0.5 cm x 1 cm x 0.3 cm  Post-Debridement Wound Measurements: same as pre-debridement. Type of Debridement: sharp selective Tissue Removed: Devitalized soft-tissue Dressing: Dry, sterile, compression dressing. Disposition: Patient  tolerated procedure well. Patient to return in 1 week for follow-up.   Return for post-op care.

## 2019-07-14 NOTE — Telephone Encounter (Signed)
"  Could you call me in regards to scheduling a surgery with Dr. Hardie Pulley in [place]."

## 2019-07-14 NOTE — Patient Instructions (Signed)
Pre-Operative Instructions  Congratulations, you have decided to take an important step towards improving your quality of life.  You can be assured that the doctors and staff at [facility] will be with you every step of the way.  Here are some important things you should know:  1. Plan to be at the surgery center/hospital at least 1 (one) hour prior to your scheduled time, unless otherwise directed by the surgical center/hospital staff.  You must have a responsible adult accompany you, remain during the surgery and drive you home.  Make sure you have directions to the surgical center/hospital to ensure you arrive on time. 2. If you are having surgery at [facility] or [facility], you will need a copy of your medical history and physical form from your family physician within one month prior to the date of surgery. We will give you a form for your primary physician to complete.  3. We make every effort to accommodate the date you request for surgery.  However, there are times where surgery dates or times have to be moved.  We will contact you as soon as possible if a change in schedule is required.   4. No aspirin/ibuprofen for one week before surgery.  If you are on aspirin, any non-steroidal anti-inflammatory medications (Mobic, Aleve, Ibuprofen) should not be taken seven (7) days prior to your surgery.  You make take Tylenol for pain prior to surgery.  5. Medications - If you are taking daily heart and blood pressure medications, seizure, reflux, allergy, asthma, anxiety, pain or diabetes medications, make sure you notify the surgery center/hospital before the day of surgery so they can tell you which medications you should take or avoid the day of surgery. 6. No food or drink after midnight the night before surgery unless directed otherwise by surgical center/hospital staff. 7. No alcoholic beverages 24-hours prior to surgery.  No smoking 24-hours prior or 24-hours after  surgery. 8. Wear loose pants or shorts. They should be loose enough to fit over bandages, boots, and casts. 9. Don't wear slip-on shoes. Sneakers are preferred. 10. Bring your boot with you to the surgery center/hospital.  Also bring crutches or a walker if your physician has prescribed it for you.  If you do not have this equipment, it will be provided for you after surgery. 11. If you have not been contacted by the surgery center/hospital by the day before your surgery, call to confirm the date and time of your surgery. 12. Leave-time from work may vary depending on the type of surgery you have.  Appropriate arrangements should be made prior to surgery with your employer. 13. Prescriptions will be provided immediately following surgery by your doctor.  Fill these as soon as possible after surgery and take the medication as directed. Pain medications will not be refilled on weekends and must be approved by the doctor. 14. Remove nail polish on the operative foot and avoid getting pedicures prior to surgery. 15. Wash the night before surgery.  The night before surgery wash the foot and leg well with water and the antibacterial soap provided. Be sure to pay special attention to beneath the toenails and in between the toes.  Wash for at least three (3) minutes. Rinse thoroughly with water and dry well with a towel.  Perform this wash unless told not to do so by your physician.  Enclosed: 1 Ice pack (please put in freezer the night before surgery)   1 Hibiclens skin cleaner     Pre-op instructions  If you have any questions regarding the instructions, please do not hesitate to call our office.  [place]: [address], [place], [place] [address] -- [phone]  [place]: [address] [place], [place] [address] -- [phone]  : [address], , [place] [address] -- [phone]   Website: [email or web address] 

## 2019-07-15 ENCOUNTER — Telehealth: Payer: Self-pay

## 2019-07-15 ENCOUNTER — Other Ambulatory Visit: Payer: Self-pay | Admitting: *Deleted

## 2019-07-15 DIAGNOSIS — Z01818 Encounter for other preprocedural examination: Secondary | ICD-10-CM

## 2019-07-15 DIAGNOSIS — I10 Essential (primary) hypertension: Secondary | ICD-10-CM | POA: Diagnosis not present

## 2019-07-15 DIAGNOSIS — S92012D Displaced fracture of body of left calcaneus, subsequent encounter for fracture with routine healing: Secondary | ICD-10-CM | POA: Diagnosis not present

## 2019-07-15 DIAGNOSIS — E559 Vitamin D deficiency, unspecified: Secondary | ICD-10-CM | POA: Diagnosis not present

## 2019-07-15 DIAGNOSIS — L89623 Pressure ulcer of left heel, stage 3: Secondary | ICD-10-CM | POA: Diagnosis not present

## 2019-07-15 DIAGNOSIS — G6289 Other specified polyneuropathies: Secondary | ICD-10-CM | POA: Diagnosis not present

## 2019-07-15 DIAGNOSIS — M810 Age-related osteoporosis without current pathological fracture: Secondary | ICD-10-CM | POA: Diagnosis not present

## 2019-07-15 NOTE — Telephone Encounter (Signed)
I'm calling about scheduling surgery with Dr. March Rummage. Please call me back. Thank you.

## 2019-07-15 NOTE — Telephone Encounter (Signed)
Spoke to patient. He is scheduled for surgery on 08/04/2019 at [facility]. Patient has an appointment with PCP 07/16/2019 for [facility] Delydia added order for COVID testing to be done 3 to 4 days prior to surgery. Patient is aware.

## 2019-07-15 NOTE — Telephone Encounter (Signed)
Jay Warren from [facility] PT called requesting PT  Instructions for the pt, if any restrictions? Please advice

## 2019-07-15 NOTE — Telephone Encounter (Signed)
Ebony Hail from [facility] called stating they had received a call form PT stating that the Pt's Lt heel ulcer has opened back up. Ebony Hail states or requesting to know if the pt will need wound care orders.? Please advice

## 2019-07-15 NOTE — Telephone Encounter (Signed)
I was given your name by Dr. March Rummage to schedule a surgery date at [facility]. He is trying to get it scheduled for March 2nd.

## 2019-07-16 DIAGNOSIS — I10 Essential (primary) hypertension: Secondary | ICD-10-CM | POA: Diagnosis not present

## 2019-07-16 DIAGNOSIS — C3491 Malignant neoplasm of unspecified part of right bronchus or lung: Secondary | ICD-10-CM | POA: Diagnosis not present

## 2019-07-16 DIAGNOSIS — E785 Hyperlipidemia, unspecified: Secondary | ICD-10-CM | POA: Diagnosis not present

## 2019-07-16 DIAGNOSIS — Z79899 Other long term (current) drug therapy: Secondary | ICD-10-CM | POA: Diagnosis not present

## 2019-07-16 DIAGNOSIS — G40909 Epilepsy, unspecified, not intractable, without status epilepticus: Secondary | ICD-10-CM | POA: Diagnosis not present

## 2019-07-16 DIAGNOSIS — H6121 Impacted cerumen, right ear: Secondary | ICD-10-CM | POA: Diagnosis not present

## 2019-07-16 DIAGNOSIS — Z85118 Personal history of other malignant neoplasm of bronchus and lung: Secondary | ICD-10-CM | POA: Diagnosis not present

## 2019-07-16 DIAGNOSIS — E559 Vitamin D deficiency, unspecified: Secondary | ICD-10-CM | POA: Diagnosis not present

## 2019-07-17 ENCOUNTER — Telehealth: Payer: Self-pay

## 2019-07-17 NOTE — Telephone Encounter (Signed)
[name] from [facility] called requesting wound care orders for the nurse, also [name] states PT order runs out next week and if there would be any restrictions. Please advice

## 2019-07-21 ENCOUNTER — Telehealth: Payer: Self-pay

## 2019-07-21 NOTE — Telephone Encounter (Signed)
Jay Warren from [facility] called stating PT saw that the pt's Lt heel ulcer started to open back up. Jay Warren will like to know if they can have any wound orders for Jay Warren

## 2019-07-21 NOTE — Telephone Encounter (Signed)
Called and spoke with Jay Warren about the new wound care orders. Per Dr. March Rummage nurse is to start medihoney, and foam border dressing twice weekly at the Lt bottom heel. Jay Warren stated understanding

## 2019-07-21 NOTE — Telephone Encounter (Signed)
[name] from [facility] PT called stating they are still waiting on orders for PT for Jay Warren. Please advice

## 2019-07-22 DIAGNOSIS — E559 Vitamin D deficiency, unspecified: Secondary | ICD-10-CM | POA: Diagnosis not present

## 2019-07-22 DIAGNOSIS — L89623 Pressure ulcer of left heel, stage 3: Secondary | ICD-10-CM | POA: Diagnosis not present

## 2019-07-22 DIAGNOSIS — M810 Age-related osteoporosis without current pathological fracture: Secondary | ICD-10-CM | POA: Diagnosis not present

## 2019-07-22 DIAGNOSIS — S92012D Displaced fracture of body of left calcaneus, subsequent encounter for fracture with routine healing: Secondary | ICD-10-CM | POA: Diagnosis not present

## 2019-07-22 DIAGNOSIS — I10 Essential (primary) hypertension: Secondary | ICD-10-CM | POA: Diagnosis not present

## 2019-07-22 DIAGNOSIS — G6289 Other specified polyneuropathies: Secondary | ICD-10-CM | POA: Diagnosis not present

## 2019-07-23 DIAGNOSIS — Z23 Encounter for immunization: Secondary | ICD-10-CM | POA: Diagnosis not present

## 2019-07-24 DIAGNOSIS — M810 Age-related osteoporosis without current pathological fracture: Secondary | ICD-10-CM | POA: Diagnosis not present

## 2019-07-24 DIAGNOSIS — L89623 Pressure ulcer of left heel, stage 3: Secondary | ICD-10-CM | POA: Diagnosis not present

## 2019-07-24 DIAGNOSIS — I10 Essential (primary) hypertension: Secondary | ICD-10-CM | POA: Diagnosis not present

## 2019-07-24 DIAGNOSIS — G6289 Other specified polyneuropathies: Secondary | ICD-10-CM | POA: Diagnosis not present

## 2019-07-24 DIAGNOSIS — S92012D Displaced fracture of body of left calcaneus, subsequent encounter for fracture with routine healing: Secondary | ICD-10-CM | POA: Diagnosis not present

## 2019-07-24 DIAGNOSIS — E559 Vitamin D deficiency, unspecified: Secondary | ICD-10-CM | POA: Diagnosis not present

## 2019-07-25 DIAGNOSIS — Z79891 Long term (current) use of opiate analgesic: Secondary | ICD-10-CM | POA: Diagnosis not present

## 2019-07-25 DIAGNOSIS — E559 Vitamin D deficiency, unspecified: Secondary | ICD-10-CM | POA: Diagnosis not present

## 2019-07-25 DIAGNOSIS — Z85841 Personal history of malignant neoplasm of brain: Secondary | ICD-10-CM | POA: Diagnosis not present

## 2019-07-25 DIAGNOSIS — Z9181 History of falling: Secondary | ICD-10-CM | POA: Diagnosis not present

## 2019-07-25 DIAGNOSIS — G6289 Other specified polyneuropathies: Secondary | ICD-10-CM | POA: Diagnosis not present

## 2019-07-25 DIAGNOSIS — I10 Essential (primary) hypertension: Secondary | ICD-10-CM | POA: Diagnosis not present

## 2019-07-25 DIAGNOSIS — Z85118 Personal history of other malignant neoplasm of bronchus and lung: Secondary | ICD-10-CM | POA: Diagnosis not present

## 2019-07-25 DIAGNOSIS — L89623 Pressure ulcer of left heel, stage 3: Secondary | ICD-10-CM | POA: Diagnosis not present

## 2019-07-25 DIAGNOSIS — Z79899 Other long term (current) drug therapy: Secondary | ICD-10-CM | POA: Diagnosis not present

## 2019-07-25 DIAGNOSIS — S92012D Displaced fracture of body of left calcaneus, subsequent encounter for fracture with routine healing: Secondary | ICD-10-CM | POA: Diagnosis not present

## 2019-07-25 DIAGNOSIS — M810 Age-related osteoporosis without current pathological fracture: Secondary | ICD-10-CM | POA: Diagnosis not present

## 2019-07-27 ENCOUNTER — Telehealth: Payer: Self-pay | Admitting: *Deleted

## 2019-07-27 DIAGNOSIS — I10 Essential (primary) hypertension: Secondary | ICD-10-CM | POA: Diagnosis not present

## 2019-07-27 DIAGNOSIS — E559 Vitamin D deficiency, unspecified: Secondary | ICD-10-CM | POA: Diagnosis not present

## 2019-07-27 DIAGNOSIS — M810 Age-related osteoporosis without current pathological fracture: Secondary | ICD-10-CM | POA: Diagnosis not present

## 2019-07-27 DIAGNOSIS — G6289 Other specified polyneuropathies: Secondary | ICD-10-CM | POA: Diagnosis not present

## 2019-07-27 DIAGNOSIS — S92012D Displaced fracture of body of left calcaneus, subsequent encounter for fracture with routine healing: Secondary | ICD-10-CM | POA: Diagnosis not present

## 2019-07-27 DIAGNOSIS — L89623 Pressure ulcer of left heel, stage 3: Secondary | ICD-10-CM | POA: Diagnosis not present

## 2019-07-27 NOTE — Telephone Encounter (Signed)
-----   Message from Evelina Bucy, DPM sent at 07/14/2019  9:30 AM EST ----- Can we establish [facility] and heel foam dressing to wound twice weekly

## 2019-07-27 NOTE — Telephone Encounter (Signed)
See 07/21/2019 msg from Dr. March Rummage assistant, Assunta Found.

## 2019-07-30 DIAGNOSIS — G6289 Other specified polyneuropathies: Secondary | ICD-10-CM | POA: Diagnosis not present

## 2019-07-30 DIAGNOSIS — L89623 Pressure ulcer of left heel, stage 3: Secondary | ICD-10-CM | POA: Diagnosis not present

## 2019-07-30 DIAGNOSIS — I10 Essential (primary) hypertension: Secondary | ICD-10-CM | POA: Diagnosis not present

## 2019-07-30 DIAGNOSIS — E559 Vitamin D deficiency, unspecified: Secondary | ICD-10-CM | POA: Diagnosis not present

## 2019-07-30 DIAGNOSIS — M810 Age-related osteoporosis without current pathological fracture: Secondary | ICD-10-CM | POA: Diagnosis not present

## 2019-07-30 DIAGNOSIS — S92012D Displaced fracture of body of left calcaneus, subsequent encounter for fracture with routine healing: Secondary | ICD-10-CM | POA: Diagnosis not present

## 2019-07-31 DIAGNOSIS — M810 Age-related osteoporosis without current pathological fracture: Secondary | ICD-10-CM | POA: Diagnosis not present

## 2019-08-01 DIAGNOSIS — Z20828 Contact with and (suspected) exposure to other viral communicable diseases: Secondary | ICD-10-CM | POA: Diagnosis not present

## 2019-08-03 ENCOUNTER — Telehealth: Payer: Self-pay

## 2019-08-03 DIAGNOSIS — G6289 Other specified polyneuropathies: Secondary | ICD-10-CM | POA: Diagnosis not present

## 2019-08-03 DIAGNOSIS — E559 Vitamin D deficiency, unspecified: Secondary | ICD-10-CM | POA: Diagnosis not present

## 2019-08-03 DIAGNOSIS — L89623 Pressure ulcer of left heel, stage 3: Secondary | ICD-10-CM | POA: Diagnosis not present

## 2019-08-03 DIAGNOSIS — S92012D Displaced fracture of body of left calcaneus, subsequent encounter for fracture with routine healing: Secondary | ICD-10-CM | POA: Diagnosis not present

## 2019-08-03 DIAGNOSIS — I10 Essential (primary) hypertension: Secondary | ICD-10-CM | POA: Diagnosis not present

## 2019-08-03 DIAGNOSIS — M810 Age-related osteoporosis without current pathological fracture: Secondary | ICD-10-CM | POA: Diagnosis not present

## 2019-08-03 NOTE — Telephone Encounter (Signed)
Jay Warren from [facility] called stating that the Pt's ulcer at Lt foot looks very macerated around the edges and she is requesting new vebal wound care orders after surgery.

## 2019-08-04 ENCOUNTER — Encounter: Payer: Self-pay | Admitting: Podiatry

## 2019-08-04 ENCOUNTER — Telehealth: Payer: Self-pay | Admitting: *Deleted

## 2019-08-04 DIAGNOSIS — L97528 Non-pressure chronic ulcer of other part of left foot with other specified severity: Secondary | ICD-10-CM | POA: Diagnosis not present

## 2019-08-04 DIAGNOSIS — L97421 Non-pressure chronic ulcer of left heel and midfoot limited to breakdown of skin: Secondary | ICD-10-CM | POA: Diagnosis not present

## 2019-08-04 DIAGNOSIS — Z87891 Personal history of nicotine dependence: Secondary | ICD-10-CM | POA: Diagnosis not present

## 2019-08-04 DIAGNOSIS — Z8673 Personal history of transient ischemic attack (TIA), and cerebral infarction without residual deficits: Secondary | ICD-10-CM | POA: Diagnosis not present

## 2019-08-04 DIAGNOSIS — I10 Essential (primary) hypertension: Secondary | ICD-10-CM | POA: Diagnosis not present

## 2019-08-04 DIAGNOSIS — J449 Chronic obstructive pulmonary disease, unspecified: Secondary | ICD-10-CM | POA: Diagnosis not present

## 2019-08-04 DIAGNOSIS — E785 Hyperlipidemia, unspecified: Secondary | ICD-10-CM | POA: Diagnosis not present

## 2019-08-04 DIAGNOSIS — Z85828 Personal history of other malignant neoplasm of skin: Secondary | ICD-10-CM | POA: Diagnosis not present

## 2019-08-04 DIAGNOSIS — M7989 Other specified soft tissue disorders: Secondary | ICD-10-CM | POA: Diagnosis not present

## 2019-08-04 DIAGNOSIS — E11621 Type 2 diabetes mellitus with foot ulcer: Secondary | ICD-10-CM | POA: Diagnosis not present

## 2019-08-04 MED ORDER — CLINDAMYCIN HCL 150 MG PO CAPS: ORAL_CAPSULE | ORAL | 0 refills | Status: DC

## 2019-08-04 NOTE — Telephone Encounter (Signed)
Dr. March Rummage - Secure Chat - Can we send clindamycin 150 mg bid #14 to [facility] in [place]. I'm at [facility] and not sure it send from here. Orders escribed to [place].

## 2019-08-04 NOTE — Telephone Encounter (Signed)
Keep dressing on until post-op  appt then will give orders

## 2019-08-05 ENCOUNTER — Telehealth: Payer: Self-pay

## 2019-08-05 NOTE — Telephone Encounter (Signed)
POST OP CALL-    1) General condition stated by the patient:   2) Is the pt having pain?   3) Pain score:   4) Has the pt taken Rx'd pain medication, regularly or PRN?   5) Is the pain medication giving relief?  6) Any fever, chills, nausea, or vomiting, shortness of breath or tightness in calf?  7) Is the bandage clean, dry and intact?  8) Is there excessive tightness, bleeding or drainage coming through the bandage?  9) Did you understand all of the post op instruction sheet given?  10) Any questions or concerns regarding post op care/recovery?    Confirmed POV appointment with patient      Pt did not answer. Left voicemail stating if the patient had any concerns or questions to please call the office, and that we would see him at his next scheduled post op appointment.

## 2019-08-05 NOTE — Telephone Encounter (Signed)
Called and spoke with [name] from [facility] and stated to keep dressing on until post op appt.

## 2019-08-06 ENCOUNTER — Telehealth: Payer: Self-pay

## 2019-08-06 DIAGNOSIS — M810 Age-related osteoporosis without current pathological fracture: Secondary | ICD-10-CM | POA: Diagnosis not present

## 2019-08-06 DIAGNOSIS — E559 Vitamin D deficiency, unspecified: Secondary | ICD-10-CM | POA: Diagnosis not present

## 2019-08-06 DIAGNOSIS — G6289 Other specified polyneuropathies: Secondary | ICD-10-CM | POA: Diagnosis not present

## 2019-08-06 DIAGNOSIS — I10 Essential (primary) hypertension: Secondary | ICD-10-CM | POA: Diagnosis not present

## 2019-08-06 DIAGNOSIS — L89623 Pressure ulcer of left heel, stage 3: Secondary | ICD-10-CM | POA: Diagnosis not present

## 2019-08-06 DIAGNOSIS — S92012D Displaced fracture of body of left calcaneus, subsequent encounter for fracture with routine healing: Secondary | ICD-10-CM | POA: Diagnosis not present

## 2019-08-06 NOTE — Telephone Encounter (Signed)
[name] from [facility] called stating that this past Monday for wound dressing she only changed the pt's outer dressing. [name] would like to know if it's fine to skip this Monday visit with the pt and wait what to hear from the doctor for wound orders after the pt's Tuesday appt.

## 2019-08-06 NOTE — Telephone Encounter (Signed)
Spoke to [place] from [facility] and informed her that per Dr. March Rummage it was okay to skip wound care visit with Amara Manalang this Monday.

## 2019-08-06 NOTE — Telephone Encounter (Signed)
Ok to skip Monday

## 2019-08-11 ENCOUNTER — Ambulatory Visit (INDEPENDENT_AMBULATORY_CARE_PROVIDER_SITE_OTHER): Payer: Medicare Other | Admitting: Podiatry

## 2019-08-11 ENCOUNTER — Other Ambulatory Visit: Payer: Self-pay

## 2019-08-11 DIAGNOSIS — L97421 Non-pressure chronic ulcer of left heel and midfoot limited to breakdown of skin: Secondary | ICD-10-CM | POA: Diagnosis not present

## 2019-08-11 NOTE — Progress Notes (Signed)
  Subjective:  Patient ID: Jay Warren, male    DOB: 1934-01-07,  MRN: [id number]  Chief Complaint  Patient presents with  . Routine Post Op    POV Pt. denies any epsisodes of sever pain -pt states he has been doing pretty good after surgery tx: sx shoe, abx and elevation -pt denies N/V/?Fhc     86 y.o. male presents with the above complaint. History confirmed with patient.   Objective:  Physical Exam: warm, good capillary refill,  normal DP and PT pulses and normal sensory exam. Left Foot: wound left heel 0.5 x0.5 less prominent bone compared to previous. No warmth erythema signs of infection.   Assessment:   1. Ulcer of heel, left, limited to breakdown of skin [facility])      Plan:  Patient was evaluated and treated and all questions answered.  Wound Left Heel -Improving, no debridement today -Abx ointment and mepilex border dressing applied. -F/u in 2 weeks for wound check  Return in about 2 weeks (around 08/25/2019) for Wound check .   MDM

## 2019-08-13 ENCOUNTER — Telehealth: Payer: Self-pay

## 2019-08-13 DIAGNOSIS — M810 Age-related osteoporosis without current pathological fracture: Secondary | ICD-10-CM | POA: Diagnosis not present

## 2019-08-13 DIAGNOSIS — S92012D Displaced fracture of body of left calcaneus, subsequent encounter for fracture with routine healing: Secondary | ICD-10-CM | POA: Diagnosis not present

## 2019-08-13 DIAGNOSIS — I10 Essential (primary) hypertension: Secondary | ICD-10-CM | POA: Diagnosis not present

## 2019-08-13 DIAGNOSIS — G6289 Other specified polyneuropathies: Secondary | ICD-10-CM | POA: Diagnosis not present

## 2019-08-13 DIAGNOSIS — E559 Vitamin D deficiency, unspecified: Secondary | ICD-10-CM | POA: Diagnosis not present

## 2019-08-13 DIAGNOSIS — L89623 Pressure ulcer of left heel, stage 3: Secondary | ICD-10-CM | POA: Diagnosis not present

## 2019-08-13 NOTE — Telephone Encounter (Signed)
Apply ointment and border gauze foam dressing twice weekly

## 2019-08-13 NOTE — Telephone Encounter (Signed)
Jay Warren from [facility] called requesting further wound care orders for Lt heel  And the frequency of care? Please advice

## 2019-08-13 NOTE — Telephone Encounter (Signed)
Spoke with [name] from [facility] and gave new wound orders for Mr. Jay Warren

## 2019-08-14 ENCOUNTER — Telehealth: Payer: Self-pay | Admitting: *Deleted

## 2019-08-14 NOTE — Telephone Encounter (Signed)
Jay Warren states she missed a call from our office concerning continuing pt's wound care.

## 2019-08-14 NOTE — Telephone Encounter (Signed)
Already spoke with [name] from [facility] yesterday and gave new wound care orders

## 2019-08-17 DIAGNOSIS — G6289 Other specified polyneuropathies: Secondary | ICD-10-CM | POA: Diagnosis not present

## 2019-08-17 DIAGNOSIS — S92012D Displaced fracture of body of left calcaneus, subsequent encounter for fracture with routine healing: Secondary | ICD-10-CM | POA: Diagnosis not present

## 2019-08-17 DIAGNOSIS — I10 Essential (primary) hypertension: Secondary | ICD-10-CM | POA: Diagnosis not present

## 2019-08-17 DIAGNOSIS — L89623 Pressure ulcer of left heel, stage 3: Secondary | ICD-10-CM | POA: Diagnosis not present

## 2019-08-17 DIAGNOSIS — M810 Age-related osteoporosis without current pathological fracture: Secondary | ICD-10-CM | POA: Diagnosis not present

## 2019-08-17 DIAGNOSIS — E559 Vitamin D deficiency, unspecified: Secondary | ICD-10-CM | POA: Diagnosis not present

## 2019-08-18 ENCOUNTER — Encounter: Payer: Medicare Other | Admitting: Podiatry

## 2019-08-20 DIAGNOSIS — L89623 Pressure ulcer of left heel, stage 3: Secondary | ICD-10-CM | POA: Diagnosis not present

## 2019-08-20 DIAGNOSIS — E559 Vitamin D deficiency, unspecified: Secondary | ICD-10-CM | POA: Diagnosis not present

## 2019-08-20 DIAGNOSIS — S92012D Displaced fracture of body of left calcaneus, subsequent encounter for fracture with routine healing: Secondary | ICD-10-CM | POA: Diagnosis not present

## 2019-08-20 DIAGNOSIS — I10 Essential (primary) hypertension: Secondary | ICD-10-CM | POA: Diagnosis not present

## 2019-08-20 DIAGNOSIS — M810 Age-related osteoporosis without current pathological fracture: Secondary | ICD-10-CM | POA: Diagnosis not present

## 2019-08-20 DIAGNOSIS — G6289 Other specified polyneuropathies: Secondary | ICD-10-CM | POA: Diagnosis not present

## 2019-08-24 DIAGNOSIS — S92012D Displaced fracture of body of left calcaneus, subsequent encounter for fracture with routine healing: Secondary | ICD-10-CM | POA: Diagnosis not present

## 2019-08-24 DIAGNOSIS — Z9181 History of falling: Secondary | ICD-10-CM | POA: Diagnosis not present

## 2019-08-24 DIAGNOSIS — G6289 Other specified polyneuropathies: Secondary | ICD-10-CM | POA: Diagnosis not present

## 2019-08-24 DIAGNOSIS — L89623 Pressure ulcer of left heel, stage 3: Secondary | ICD-10-CM | POA: Diagnosis not present

## 2019-08-24 DIAGNOSIS — Z85118 Personal history of other malignant neoplasm of bronchus and lung: Secondary | ICD-10-CM | POA: Diagnosis not present

## 2019-08-24 DIAGNOSIS — Z85841 Personal history of malignant neoplasm of brain: Secondary | ICD-10-CM | POA: Diagnosis not present

## 2019-08-24 DIAGNOSIS — E559 Vitamin D deficiency, unspecified: Secondary | ICD-10-CM | POA: Diagnosis not present

## 2019-08-24 DIAGNOSIS — I10 Essential (primary) hypertension: Secondary | ICD-10-CM | POA: Diagnosis not present

## 2019-08-24 DIAGNOSIS — Z79891 Long term (current) use of opiate analgesic: Secondary | ICD-10-CM | POA: Diagnosis not present

## 2019-08-24 DIAGNOSIS — Z79899 Other long term (current) drug therapy: Secondary | ICD-10-CM | POA: Diagnosis not present

## 2019-08-24 DIAGNOSIS — M810 Age-related osteoporosis without current pathological fracture: Secondary | ICD-10-CM | POA: Diagnosis not present

## 2019-08-25 ENCOUNTER — Other Ambulatory Visit: Payer: Self-pay

## 2019-08-25 ENCOUNTER — Ambulatory Visit (INDEPENDENT_AMBULATORY_CARE_PROVIDER_SITE_OTHER): Payer: Medicare Other | Admitting: Podiatry

## 2019-08-25 DIAGNOSIS — L97421 Non-pressure chronic ulcer of left heel and midfoot limited to breakdown of skin: Secondary | ICD-10-CM

## 2019-08-25 NOTE — Progress Notes (Signed)
  Subjective:  Patient ID: Jay Warren, male    DOB: 07/13/1933,  MRN: [id number]  Thinks the wound has bene doing good, no drainage redness or swelling. Only occassional soreness.  86 y.o. male presents with the above complaint. History confirmed with patient.   Objective:  Physical Exam: warm, good capillary refill,  normal DP and PT pulses and normal sensory exam. Left Foot: wound left heel 0.3x0.3  No warmth erythema signs of infection. Slight maceration  Assessment:   1. Ulcer of heel, left, limited to breakdown of skin [facility])    Plan:  Patient was evaluated and treated and all questions answered.  Wound Left Heel -Improving, selective debridement today -Dressed with medihoney and Mepilex border dressing. -It does appear improved compared to pre-op, however if the graft does not promote more maturation he may require an additional graft application. Will further discuss next visit. -F/u in 2 weeks for recheck. Patient's son asked to extend to 3, I did find this reasonable.  Return in about 3 weeks (around 09/15/2019) for Wound Care.

## 2019-08-27 DIAGNOSIS — I10 Essential (primary) hypertension: Secondary | ICD-10-CM | POA: Diagnosis not present

## 2019-08-27 DIAGNOSIS — E559 Vitamin D deficiency, unspecified: Secondary | ICD-10-CM | POA: Diagnosis not present

## 2019-08-27 DIAGNOSIS — M810 Age-related osteoporosis without current pathological fracture: Secondary | ICD-10-CM | POA: Diagnosis not present

## 2019-08-27 DIAGNOSIS — G6289 Other specified polyneuropathies: Secondary | ICD-10-CM | POA: Diagnosis not present

## 2019-08-27 DIAGNOSIS — L89623 Pressure ulcer of left heel, stage 3: Secondary | ICD-10-CM | POA: Diagnosis not present

## 2019-08-27 DIAGNOSIS — S92012D Displaced fracture of body of left calcaneus, subsequent encounter for fracture with routine healing: Secondary | ICD-10-CM | POA: Diagnosis not present

## 2019-08-31 DIAGNOSIS — L89623 Pressure ulcer of left heel, stage 3: Secondary | ICD-10-CM | POA: Diagnosis not present

## 2019-08-31 DIAGNOSIS — E559 Vitamin D deficiency, unspecified: Secondary | ICD-10-CM | POA: Diagnosis not present

## 2019-08-31 DIAGNOSIS — I10 Essential (primary) hypertension: Secondary | ICD-10-CM | POA: Diagnosis not present

## 2019-08-31 DIAGNOSIS — S92012D Displaced fracture of body of left calcaneus, subsequent encounter for fracture with routine healing: Secondary | ICD-10-CM | POA: Diagnosis not present

## 2019-08-31 DIAGNOSIS — M810 Age-related osteoporosis without current pathological fracture: Secondary | ICD-10-CM | POA: Diagnosis not present

## 2019-08-31 DIAGNOSIS — G6289 Other specified polyneuropathies: Secondary | ICD-10-CM | POA: Diagnosis not present

## 2019-09-01 ENCOUNTER — Encounter: Payer: Medicare Other | Admitting: Podiatry

## 2019-09-03 DIAGNOSIS — L89623 Pressure ulcer of left heel, stage 3: Secondary | ICD-10-CM | POA: Diagnosis not present

## 2019-09-03 DIAGNOSIS — E559 Vitamin D deficiency, unspecified: Secondary | ICD-10-CM | POA: Diagnosis not present

## 2019-09-03 DIAGNOSIS — M810 Age-related osteoporosis without current pathological fracture: Secondary | ICD-10-CM | POA: Diagnosis not present

## 2019-09-03 DIAGNOSIS — G6289 Other specified polyneuropathies: Secondary | ICD-10-CM | POA: Diagnosis not present

## 2019-09-03 DIAGNOSIS — I10 Essential (primary) hypertension: Secondary | ICD-10-CM | POA: Diagnosis not present

## 2019-09-03 DIAGNOSIS — S92012D Displaced fracture of body of left calcaneus, subsequent encounter for fracture with routine healing: Secondary | ICD-10-CM | POA: Diagnosis not present

## 2019-09-07 DIAGNOSIS — S92012D Displaced fracture of body of left calcaneus, subsequent encounter for fracture with routine healing: Secondary | ICD-10-CM | POA: Diagnosis not present

## 2019-09-07 DIAGNOSIS — L89623 Pressure ulcer of left heel, stage 3: Secondary | ICD-10-CM | POA: Diagnosis not present

## 2019-09-07 DIAGNOSIS — E559 Vitamin D deficiency, unspecified: Secondary | ICD-10-CM | POA: Diagnosis not present

## 2019-09-07 DIAGNOSIS — M810 Age-related osteoporosis without current pathological fracture: Secondary | ICD-10-CM | POA: Diagnosis not present

## 2019-09-07 DIAGNOSIS — G6289 Other specified polyneuropathies: Secondary | ICD-10-CM | POA: Diagnosis not present

## 2019-09-07 DIAGNOSIS — I10 Essential (primary) hypertension: Secondary | ICD-10-CM | POA: Diagnosis not present

## 2019-09-10 DIAGNOSIS — E559 Vitamin D deficiency, unspecified: Secondary | ICD-10-CM | POA: Diagnosis not present

## 2019-09-10 DIAGNOSIS — I10 Essential (primary) hypertension: Secondary | ICD-10-CM | POA: Diagnosis not present

## 2019-09-10 DIAGNOSIS — M810 Age-related osteoporosis without current pathological fracture: Secondary | ICD-10-CM | POA: Diagnosis not present

## 2019-09-10 DIAGNOSIS — L89623 Pressure ulcer of left heel, stage 3: Secondary | ICD-10-CM | POA: Diagnosis not present

## 2019-09-10 DIAGNOSIS — G6289 Other specified polyneuropathies: Secondary | ICD-10-CM | POA: Diagnosis not present

## 2019-09-10 DIAGNOSIS — S92012D Displaced fracture of body of left calcaneus, subsequent encounter for fracture with routine healing: Secondary | ICD-10-CM | POA: Diagnosis not present

## 2019-09-14 ENCOUNTER — Other Ambulatory Visit: Payer: Self-pay

## 2019-09-14 DIAGNOSIS — L89623 Pressure ulcer of left heel, stage 3: Secondary | ICD-10-CM | POA: Diagnosis not present

## 2019-09-14 DIAGNOSIS — E559 Vitamin D deficiency, unspecified: Secondary | ICD-10-CM | POA: Diagnosis not present

## 2019-09-14 DIAGNOSIS — G6289 Other specified polyneuropathies: Secondary | ICD-10-CM | POA: Diagnosis not present

## 2019-09-14 DIAGNOSIS — M810 Age-related osteoporosis without current pathological fracture: Secondary | ICD-10-CM | POA: Diagnosis not present

## 2019-09-14 DIAGNOSIS — I10 Essential (primary) hypertension: Secondary | ICD-10-CM | POA: Diagnosis not present

## 2019-09-14 DIAGNOSIS — S92012D Displaced fracture of body of left calcaneus, subsequent encounter for fracture with routine healing: Secondary | ICD-10-CM | POA: Diagnosis not present

## 2019-09-15 ENCOUNTER — Ambulatory Visit (INDEPENDENT_AMBULATORY_CARE_PROVIDER_SITE_OTHER): Payer: Medicare Other | Admitting: Podiatry

## 2019-09-15 ENCOUNTER — Telehealth: Payer: Self-pay

## 2019-09-15 ENCOUNTER — Other Ambulatory Visit: Payer: Self-pay

## 2019-09-15 DIAGNOSIS — L97421 Non-pressure chronic ulcer of left heel and midfoot limited to breakdown of skin: Secondary | ICD-10-CM | POA: Diagnosis not present

## 2019-09-15 NOTE — Telephone Encounter (Signed)
Spoke with Bryson Ha from [facility] and gave new wound orders to apply silvadene and foam dressing on the Lt heel and to reinitiate PT.

## 2019-09-15 NOTE — Telephone Encounter (Signed)
-----   Message from Evelina Bucy, DPM sent at 09/15/2019  8:49 AM EDT ----- Can we send orders to [facility] to reinitiate PT. Also silvadene and foam dressing to left heel

## 2019-09-15 NOTE — Progress Notes (Signed)
  Subjective:  Patient ID: [name], male    DOB: [birth year],  MRN: [id number]  Chief Complaint  Patient presents with  . Wound Check    F/U Lt heel ulcer check Pt. states," I think it's doing good. pain only if I step on it wrong." -pt denies redness/swelling -w/ less drianage Tx: medihone and bandaid    86 y.o. male presents with the above complaint. History confirmed with patient. No pain when walking, but not walking as much as he would like to.  Objective:  Physical Exam: warm, good capillary refill,  normal DP and PT pulses and normal sensory exam. Left Foot: wound left heel 1x1 fully granular base.  No warmth erythema signs of infection. Slight maceration  Assessment:   1. Ulcer of heel, left, limited to breakdown of skin [facility])    Plan:  Patient was evaluated and treated and all questions answered.  Wound Left Heel -Wound slightly larger today after trial of weightbearing. Minimal debridement today. -Restart PT - pt has been deconditioned -F/u in 2 weeks for recheck.  No follow-ups on file.

## 2019-09-17 DIAGNOSIS — L89623 Pressure ulcer of left heel, stage 3: Secondary | ICD-10-CM | POA: Diagnosis not present

## 2019-09-17 DIAGNOSIS — I10 Essential (primary) hypertension: Secondary | ICD-10-CM | POA: Diagnosis not present

## 2019-09-17 DIAGNOSIS — M810 Age-related osteoporosis without current pathological fracture: Secondary | ICD-10-CM | POA: Diagnosis not present

## 2019-09-17 DIAGNOSIS — S92012D Displaced fracture of body of left calcaneus, subsequent encounter for fracture with routine healing: Secondary | ICD-10-CM | POA: Diagnosis not present

## 2019-09-17 DIAGNOSIS — E559 Vitamin D deficiency, unspecified: Secondary | ICD-10-CM | POA: Diagnosis not present

## 2019-09-17 DIAGNOSIS — G6289 Other specified polyneuropathies: Secondary | ICD-10-CM | POA: Diagnosis not present

## 2019-09-21 DIAGNOSIS — I10 Essential (primary) hypertension: Secondary | ICD-10-CM | POA: Diagnosis not present

## 2019-09-21 DIAGNOSIS — M810 Age-related osteoporosis without current pathological fracture: Secondary | ICD-10-CM | POA: Diagnosis not present

## 2019-09-21 DIAGNOSIS — S92012D Displaced fracture of body of left calcaneus, subsequent encounter for fracture with routine healing: Secondary | ICD-10-CM | POA: Diagnosis not present

## 2019-09-21 DIAGNOSIS — E559 Vitamin D deficiency, unspecified: Secondary | ICD-10-CM | POA: Diagnosis not present

## 2019-09-21 DIAGNOSIS — G6289 Other specified polyneuropathies: Secondary | ICD-10-CM | POA: Diagnosis not present

## 2019-09-21 DIAGNOSIS — L89623 Pressure ulcer of left heel, stage 3: Secondary | ICD-10-CM | POA: Diagnosis not present

## 2019-09-22 ENCOUNTER — Telehealth: Payer: Self-pay

## 2019-09-22 DIAGNOSIS — E559 Vitamin D deficiency, unspecified: Secondary | ICD-10-CM | POA: Diagnosis not present

## 2019-09-22 DIAGNOSIS — I10 Essential (primary) hypertension: Secondary | ICD-10-CM | POA: Diagnosis not present

## 2019-09-22 DIAGNOSIS — G6289 Other specified polyneuropathies: Secondary | ICD-10-CM | POA: Diagnosis not present

## 2019-09-22 DIAGNOSIS — L89623 Pressure ulcer of left heel, stage 3: Secondary | ICD-10-CM | POA: Diagnosis not present

## 2019-09-22 DIAGNOSIS — M810 Age-related osteoporosis without current pathological fracture: Secondary | ICD-10-CM | POA: Diagnosis not present

## 2019-09-22 DIAGNOSIS — S92012D Displaced fracture of body of left calcaneus, subsequent encounter for fracture with routine healing: Secondary | ICD-10-CM | POA: Diagnosis not present

## 2019-09-22 NOTE — Telephone Encounter (Signed)
PT from [facility] called requesting verbal orders for PT for once/week for the next 60 days

## 2019-09-23 DIAGNOSIS — S92012D Displaced fracture of body of left calcaneus, subsequent encounter for fracture with routine healing: Secondary | ICD-10-CM | POA: Diagnosis not present

## 2019-09-23 DIAGNOSIS — Z9181 History of falling: Secondary | ICD-10-CM | POA: Diagnosis not present

## 2019-09-23 DIAGNOSIS — Z79891 Long term (current) use of opiate analgesic: Secondary | ICD-10-CM | POA: Diagnosis not present

## 2019-09-23 DIAGNOSIS — Z85841 Personal history of malignant neoplasm of brain: Secondary | ICD-10-CM | POA: Diagnosis not present

## 2019-09-23 DIAGNOSIS — E559 Vitamin D deficiency, unspecified: Secondary | ICD-10-CM | POA: Diagnosis not present

## 2019-09-23 DIAGNOSIS — Z79899 Other long term (current) drug therapy: Secondary | ICD-10-CM | POA: Diagnosis not present

## 2019-09-23 DIAGNOSIS — I10 Essential (primary) hypertension: Secondary | ICD-10-CM | POA: Diagnosis not present

## 2019-09-23 DIAGNOSIS — M810 Age-related osteoporosis without current pathological fracture: Secondary | ICD-10-CM | POA: Diagnosis not present

## 2019-09-23 DIAGNOSIS — L89623 Pressure ulcer of left heel, stage 3: Secondary | ICD-10-CM | POA: Diagnosis not present

## 2019-09-23 DIAGNOSIS — Z85118 Personal history of other malignant neoplasm of bronchus and lung: Secondary | ICD-10-CM | POA: Diagnosis not present

## 2019-09-23 DIAGNOSIS — G6289 Other specified polyneuropathies: Secondary | ICD-10-CM | POA: Diagnosis not present

## 2019-09-24 ENCOUNTER — Telehealth: Payer: Self-pay

## 2019-09-24 DIAGNOSIS — S92012D Displaced fracture of body of left calcaneus, subsequent encounter for fracture with routine healing: Secondary | ICD-10-CM | POA: Diagnosis not present

## 2019-09-24 DIAGNOSIS — G6289 Other specified polyneuropathies: Secondary | ICD-10-CM | POA: Diagnosis not present

## 2019-09-24 DIAGNOSIS — E559 Vitamin D deficiency, unspecified: Secondary | ICD-10-CM | POA: Diagnosis not present

## 2019-09-24 DIAGNOSIS — I10 Essential (primary) hypertension: Secondary | ICD-10-CM | POA: Diagnosis not present

## 2019-09-24 DIAGNOSIS — L89623 Pressure ulcer of left heel, stage 3: Secondary | ICD-10-CM | POA: Diagnosis not present

## 2019-09-24 DIAGNOSIS — M810 Age-related osteoporosis without current pathological fracture: Secondary | ICD-10-CM | POA: Diagnosis not present

## 2019-09-24 NOTE — Telephone Encounter (Signed)
Chista PT from [facility] called requesting/confirming verbal orders for Pt.  PT once/week for next 60 days for  8-9 wks starting on Sunday. Please advice

## 2019-09-24 NOTE — Telephone Encounter (Signed)
Spoke with Christa PT from [facility] and advised her of approval for  Pt's PT

## 2019-09-24 NOTE — Telephone Encounter (Signed)
Approve

## 2019-09-28 ENCOUNTER — Telehealth: Payer: Self-pay | Admitting: *Deleted

## 2019-09-28 DIAGNOSIS — L89623 Pressure ulcer of left heel, stage 3: Secondary | ICD-10-CM | POA: Diagnosis not present

## 2019-09-28 DIAGNOSIS — S92012D Displaced fracture of body of left calcaneus, subsequent encounter for fracture with routine healing: Secondary | ICD-10-CM | POA: Diagnosis not present

## 2019-09-28 DIAGNOSIS — G6289 Other specified polyneuropathies: Secondary | ICD-10-CM | POA: Diagnosis not present

## 2019-09-28 DIAGNOSIS — I10 Essential (primary) hypertension: Secondary | ICD-10-CM | POA: Diagnosis not present

## 2019-09-28 DIAGNOSIS — M810 Age-related osteoporosis without current pathological fracture: Secondary | ICD-10-CM | POA: Diagnosis not present

## 2019-09-28 DIAGNOSIS — E559 Vitamin D deficiency, unspecified: Secondary | ICD-10-CM | POA: Diagnosis not present

## 2019-09-28 NOTE — Telephone Encounter (Signed)
[name] [facility] request the ICD.10 codes and how pressure ulcer on left heel was coded, II or III, to be faxed with LOV notes. I faxed the 09/15/2019, and 08/25/2019 notes to [facility].

## 2019-09-29 ENCOUNTER — Telehealth: Payer: Self-pay

## 2019-09-29 ENCOUNTER — Ambulatory Visit: Payer: Medicare Other | Admitting: Podiatry

## 2019-09-29 DIAGNOSIS — E559 Vitamin D deficiency, unspecified: Secondary | ICD-10-CM | POA: Diagnosis not present

## 2019-09-29 DIAGNOSIS — M810 Age-related osteoporosis without current pathological fracture: Secondary | ICD-10-CM | POA: Diagnosis not present

## 2019-09-29 DIAGNOSIS — I10 Essential (primary) hypertension: Secondary | ICD-10-CM | POA: Diagnosis not present

## 2019-09-29 DIAGNOSIS — S92012D Displaced fracture of body of left calcaneus, subsequent encounter for fracture with routine healing: Secondary | ICD-10-CM | POA: Diagnosis not present

## 2019-09-29 DIAGNOSIS — L89623 Pressure ulcer of left heel, stage 3: Secondary | ICD-10-CM | POA: Diagnosis not present

## 2019-09-29 DIAGNOSIS — G6289 Other specified polyneuropathies: Secondary | ICD-10-CM | POA: Diagnosis not present

## 2019-09-29 NOTE — Telephone Encounter (Signed)
Approved.  

## 2019-09-29 NOTE — Telephone Encounter (Signed)
Ebony Hail from [facility] called stating pt's heel wound is still very macerated and would like to get verbal okay to try Aquacel and skin prep on pt's wound.

## 2019-09-29 NOTE — Telephone Encounter (Signed)
Spoke to [place] from [facility] and advised her that per Dr. March Rummage he approves to start trying on the Pt aquacel and skin prep

## 2019-10-01 DIAGNOSIS — I10 Essential (primary) hypertension: Secondary | ICD-10-CM | POA: Diagnosis not present

## 2019-10-01 DIAGNOSIS — M810 Age-related osteoporosis without current pathological fracture: Secondary | ICD-10-CM | POA: Diagnosis not present

## 2019-10-01 DIAGNOSIS — S92012D Displaced fracture of body of left calcaneus, subsequent encounter for fracture with routine healing: Secondary | ICD-10-CM | POA: Diagnosis not present

## 2019-10-01 DIAGNOSIS — L89623 Pressure ulcer of left heel, stage 3: Secondary | ICD-10-CM | POA: Diagnosis not present

## 2019-10-01 DIAGNOSIS — E559 Vitamin D deficiency, unspecified: Secondary | ICD-10-CM | POA: Diagnosis not present

## 2019-10-01 DIAGNOSIS — G6289 Other specified polyneuropathies: Secondary | ICD-10-CM | POA: Diagnosis not present

## 2019-10-05 DIAGNOSIS — G6289 Other specified polyneuropathies: Secondary | ICD-10-CM | POA: Diagnosis not present

## 2019-10-05 DIAGNOSIS — M810 Age-related osteoporosis without current pathological fracture: Secondary | ICD-10-CM | POA: Diagnosis not present

## 2019-10-05 DIAGNOSIS — I10 Essential (primary) hypertension: Secondary | ICD-10-CM | POA: Diagnosis not present

## 2019-10-05 DIAGNOSIS — L89623 Pressure ulcer of left heel, stage 3: Secondary | ICD-10-CM | POA: Diagnosis not present

## 2019-10-05 DIAGNOSIS — S92012D Displaced fracture of body of left calcaneus, subsequent encounter for fracture with routine healing: Secondary | ICD-10-CM | POA: Diagnosis not present

## 2019-10-05 DIAGNOSIS — E559 Vitamin D deficiency, unspecified: Secondary | ICD-10-CM | POA: Diagnosis not present

## 2019-10-07 DIAGNOSIS — L89623 Pressure ulcer of left heel, stage 3: Secondary | ICD-10-CM | POA: Diagnosis not present

## 2019-10-07 DIAGNOSIS — M810 Age-related osteoporosis without current pathological fracture: Secondary | ICD-10-CM | POA: Diagnosis not present

## 2019-10-07 DIAGNOSIS — E559 Vitamin D deficiency, unspecified: Secondary | ICD-10-CM | POA: Diagnosis not present

## 2019-10-07 DIAGNOSIS — G6289 Other specified polyneuropathies: Secondary | ICD-10-CM | POA: Diagnosis not present

## 2019-10-07 DIAGNOSIS — I10 Essential (primary) hypertension: Secondary | ICD-10-CM | POA: Diagnosis not present

## 2019-10-07 DIAGNOSIS — S92012D Displaced fracture of body of left calcaneus, subsequent encounter for fracture with routine healing: Secondary | ICD-10-CM | POA: Diagnosis not present

## 2019-10-08 DIAGNOSIS — I10 Essential (primary) hypertension: Secondary | ICD-10-CM | POA: Diagnosis not present

## 2019-10-08 DIAGNOSIS — G6289 Other specified polyneuropathies: Secondary | ICD-10-CM | POA: Diagnosis not present

## 2019-10-08 DIAGNOSIS — E559 Vitamin D deficiency, unspecified: Secondary | ICD-10-CM | POA: Diagnosis not present

## 2019-10-08 DIAGNOSIS — M810 Age-related osteoporosis without current pathological fracture: Secondary | ICD-10-CM | POA: Diagnosis not present

## 2019-10-08 DIAGNOSIS — L89623 Pressure ulcer of left heel, stage 3: Secondary | ICD-10-CM | POA: Diagnosis not present

## 2019-10-08 DIAGNOSIS — S92012D Displaced fracture of body of left calcaneus, subsequent encounter for fracture with routine healing: Secondary | ICD-10-CM | POA: Diagnosis not present

## 2019-10-12 ENCOUNTER — Ambulatory Visit: Payer: Medicare Other | Admitting: Podiatry

## 2019-10-14 DIAGNOSIS — E559 Vitamin D deficiency, unspecified: Secondary | ICD-10-CM | POA: Diagnosis not present

## 2019-10-14 DIAGNOSIS — G6289 Other specified polyneuropathies: Secondary | ICD-10-CM | POA: Diagnosis not present

## 2019-10-14 DIAGNOSIS — L89623 Pressure ulcer of left heel, stage 3: Secondary | ICD-10-CM | POA: Diagnosis not present

## 2019-10-14 DIAGNOSIS — S92012D Displaced fracture of body of left calcaneus, subsequent encounter for fracture with routine healing: Secondary | ICD-10-CM | POA: Diagnosis not present

## 2019-10-14 DIAGNOSIS — M810 Age-related osteoporosis without current pathological fracture: Secondary | ICD-10-CM | POA: Diagnosis not present

## 2019-10-14 DIAGNOSIS — I10 Essential (primary) hypertension: Secondary | ICD-10-CM | POA: Diagnosis not present

## 2019-10-15 DIAGNOSIS — L89623 Pressure ulcer of left heel, stage 3: Secondary | ICD-10-CM | POA: Diagnosis not present

## 2019-10-15 DIAGNOSIS — G6289 Other specified polyneuropathies: Secondary | ICD-10-CM | POA: Diagnosis not present

## 2019-10-15 DIAGNOSIS — E559 Vitamin D deficiency, unspecified: Secondary | ICD-10-CM | POA: Diagnosis not present

## 2019-10-15 DIAGNOSIS — M810 Age-related osteoporosis without current pathological fracture: Secondary | ICD-10-CM | POA: Diagnosis not present

## 2019-10-15 DIAGNOSIS — I10 Essential (primary) hypertension: Secondary | ICD-10-CM | POA: Diagnosis not present

## 2019-10-15 DIAGNOSIS — S92012D Displaced fracture of body of left calcaneus, subsequent encounter for fracture with routine healing: Secondary | ICD-10-CM | POA: Diagnosis not present

## 2019-10-19 ENCOUNTER — Ambulatory Visit (INDEPENDENT_AMBULATORY_CARE_PROVIDER_SITE_OTHER): Payer: Medicare Other | Admitting: Podiatry

## 2019-10-19 ENCOUNTER — Encounter: Payer: Self-pay | Admitting: Podiatry

## 2019-10-19 ENCOUNTER — Ambulatory Visit: Payer: Medicare Other

## 2019-10-19 ENCOUNTER — Ambulatory Visit (INDEPENDENT_AMBULATORY_CARE_PROVIDER_SITE_OTHER): Payer: Medicare Other

## 2019-10-19 ENCOUNTER — Other Ambulatory Visit: Payer: Self-pay

## 2019-10-19 DIAGNOSIS — L97421 Non-pressure chronic ulcer of left heel and midfoot limited to breakdown of skin: Secondary | ICD-10-CM

## 2019-10-19 MED ORDER — DOXYCYCLINE HYCLATE 100 MG PO TABS
100.0000 mg | ORAL_TABLET | Freq: Two times a day (BID) | ORAL | 0 refills | Status: DC
Start: 2019-10-19 — End: 2021-08-03

## 2019-10-19 NOTE — Addendum Note (Signed)
Addended by: Cranford Mon R on: 10/19/2019 01:14 PM   Modules accepted: Orders

## 2019-10-19 NOTE — Addendum Note (Signed)
Addended by: Cranford Mon R on: 10/19/2019 11:46 AM   Modules accepted: Orders

## 2019-10-19 NOTE — Progress Notes (Signed)
  Subjective:  Patient ID: Jay Warren, male    DOB: [birth year],  MRN: [id number]  Chief Complaint  Patient presents with  . Foot Ulcer    the left heel is draining and the nurse came out thurdsay and is sore and tender   86 y.o. male presents for wound care. Hx confirmed with patient.  Objective:  Physical Exam: Wound Location: left heel Wound Measurement: 1x0.6x0.3 Wound Base: Granular/Healthy Peri-wound: Reddened Exudate: Scant/small amount Serosanguinous exudate, Purulent exudate wound without warmth, erythema, signs of acute infection  No images are attached to the encounter.  Radiographs:  X-ray of the left foot: no soft tissue emphysema, no signs of osteomyelitis Assessment:   1. Ulcer of heel, left, limited to breakdown of skin [facility])      Plan:  Patient was evaluated and treated and all questions answered.  Ulcer left heel -XR reviewed with patient -Offload ulcer with surgical shoe -Wound cleansed and debrided -Silvadene and mepilex heel dressing applied. -Wound culture taken. -Rx doxycycline  Procedure: Excisional Debridement of Wound Indication: Removal of non-viable soft tissue from the wound to promote healing.  Anesthesia: none Pre-Debridement Wound Measurements: 0.2 cm x 0.5 cm x 0.3 cm  Post-Debridement Wound Measurements: 1 cm x 0.6 cm x 0.3 cm  Type of Debridement: Sharp Excisional Tissue Removed: Non-viable soft tissue Instrumentation: 312 blade and tissue nipper Depth of Debridement: subcutaneous tissue. Technique: Sharp excisional debridement to bleeding, viable wound base.  Dressing: Dry, sterile, compression dressing. Disposition: Patient tolerated procedure well. Patient to return in 1 week for follow-up.    Return in about 1 week (around 10/26/2019) for Wound Care, Left.

## 2019-10-21 DIAGNOSIS — L89623 Pressure ulcer of left heel, stage 3: Secondary | ICD-10-CM | POA: Diagnosis not present

## 2019-10-21 DIAGNOSIS — I10 Essential (primary) hypertension: Secondary | ICD-10-CM | POA: Diagnosis not present

## 2019-10-21 DIAGNOSIS — G6289 Other specified polyneuropathies: Secondary | ICD-10-CM | POA: Diagnosis not present

## 2019-10-21 DIAGNOSIS — E559 Vitamin D deficiency, unspecified: Secondary | ICD-10-CM | POA: Diagnosis not present

## 2019-10-21 DIAGNOSIS — S92012D Displaced fracture of body of left calcaneus, subsequent encounter for fracture with routine healing: Secondary | ICD-10-CM | POA: Diagnosis not present

## 2019-10-21 DIAGNOSIS — M810 Age-related osteoporosis without current pathological fracture: Secondary | ICD-10-CM | POA: Diagnosis not present

## 2019-10-22 DIAGNOSIS — I10 Essential (primary) hypertension: Secondary | ICD-10-CM | POA: Diagnosis not present

## 2019-10-22 DIAGNOSIS — S92012D Displaced fracture of body of left calcaneus, subsequent encounter for fracture with routine healing: Secondary | ICD-10-CM | POA: Diagnosis not present

## 2019-10-22 DIAGNOSIS — G6289 Other specified polyneuropathies: Secondary | ICD-10-CM | POA: Diagnosis not present

## 2019-10-22 DIAGNOSIS — L89623 Pressure ulcer of left heel, stage 3: Secondary | ICD-10-CM | POA: Diagnosis not present

## 2019-10-22 DIAGNOSIS — M810 Age-related osteoporosis without current pathological fracture: Secondary | ICD-10-CM | POA: Diagnosis not present

## 2019-10-22 DIAGNOSIS — E559 Vitamin D deficiency, unspecified: Secondary | ICD-10-CM | POA: Diagnosis not present

## 2019-10-22 LAB — WOUND CULTURE: Organism ID, Bacteria: NONE SEEN

## 2019-10-23 DIAGNOSIS — E559 Vitamin D deficiency, unspecified: Secondary | ICD-10-CM | POA: Diagnosis not present

## 2019-10-23 DIAGNOSIS — Z79899 Other long term (current) drug therapy: Secondary | ICD-10-CM | POA: Diagnosis not present

## 2019-10-23 DIAGNOSIS — Z9181 History of falling: Secondary | ICD-10-CM | POA: Diagnosis not present

## 2019-10-23 DIAGNOSIS — M810 Age-related osteoporosis without current pathological fracture: Secondary | ICD-10-CM | POA: Diagnosis not present

## 2019-10-23 DIAGNOSIS — S92012D Displaced fracture of body of left calcaneus, subsequent encounter for fracture with routine healing: Secondary | ICD-10-CM | POA: Diagnosis not present

## 2019-10-23 DIAGNOSIS — Z85118 Personal history of other malignant neoplasm of bronchus and lung: Secondary | ICD-10-CM | POA: Diagnosis not present

## 2019-10-23 DIAGNOSIS — G6289 Other specified polyneuropathies: Secondary | ICD-10-CM | POA: Diagnosis not present

## 2019-10-23 DIAGNOSIS — Z85841 Personal history of malignant neoplasm of brain: Secondary | ICD-10-CM | POA: Diagnosis not present

## 2019-10-23 DIAGNOSIS — Z79891 Long term (current) use of opiate analgesic: Secondary | ICD-10-CM | POA: Diagnosis not present

## 2019-10-23 DIAGNOSIS — L89623 Pressure ulcer of left heel, stage 3: Secondary | ICD-10-CM | POA: Diagnosis not present

## 2019-10-23 DIAGNOSIS — I10 Essential (primary) hypertension: Secondary | ICD-10-CM | POA: Diagnosis not present

## 2019-10-27 ENCOUNTER — Ambulatory Visit (INDEPENDENT_AMBULATORY_CARE_PROVIDER_SITE_OTHER): Payer: Medicare Other | Admitting: Podiatry

## 2019-10-27 ENCOUNTER — Other Ambulatory Visit: Payer: Self-pay

## 2019-10-27 DIAGNOSIS — L97421 Non-pressure chronic ulcer of left heel and midfoot limited to breakdown of skin: Secondary | ICD-10-CM

## 2019-10-27 NOTE — Progress Notes (Signed)
  Subjective:  Patient ID: Jay Warren, male    DOB: 02/14/1934,  MRN: [id number]  Chief Complaint  Patient presents with  . Ulcer    F?U Lt heel wound check Pt. states," better than last time, less pain, looks better." -w/ less drainage Tx: silver nitrate and bandage    86 y.o. male presents for wound care. Hx confirmed with patient.  Objective:  Physical Exam: Wound Location: left heel Wound Measurement: epithelialized. Wound Base: Granular/Healthy Peri-wound: Reddened Exudate:none Wound improved compared to last visit. Assessment:   1. Ulcer of heel, left, limited to breakdown of skin [facility])      Plan:  Patient was evaluated and treated and all questions answered.  Ulcer left heel -Wound much improved today. No signs of infection. Wound epithelialized. -We did discuss that he may benefit from additional fat graft augmentation but pt wishes to hold off at this time -Offered referral to wound care, declined. -Medihoney and Mepilex border dsg applied. Continue HHC for wound changes with medihoney or silvadene and foam border dressing. -F/u in 2 weeks.   Return in about 2 weeks (around 11/10/2019).

## 2019-11-13 ENCOUNTER — Telehealth: Payer: Self-pay

## 2019-11-13 NOTE — Telephone Encounter (Signed)
[name] from Pt called requesting a verbal order to re-evaluate pt nxt wk. If it's okay to do one visit to pt next week

## 2019-11-17 ENCOUNTER — Encounter: Payer: Self-pay | Admitting: Podiatry

## 2019-11-17 ENCOUNTER — Ambulatory Visit (INDEPENDENT_AMBULATORY_CARE_PROVIDER_SITE_OTHER): Payer: Medicare Other | Admitting: Podiatry

## 2019-11-17 ENCOUNTER — Other Ambulatory Visit: Payer: Self-pay

## 2019-11-17 DIAGNOSIS — L97421 Non-pressure chronic ulcer of left heel and midfoot limited to breakdown of skin: Secondary | ICD-10-CM

## 2019-11-17 NOTE — Progress Notes (Signed)
  Subjective:  Patient ID: Jay Warren, male    DOB: 05-21-1934,  MRN: [id number]  Chief Complaint  Patient presents with  . Foot Ulcer    2WK F/U- PT STATES his nurse had been changing dressings mentioned everything looks good. no signs of infection. no pain..    86 y.o. male presents for wound care. Hx confirmed with patient.  Objective:  Physical Exam: Wound Location: left heel Wound Measurement: epithelialized. Wound Base: Granular/Healthy Peri-wound: Reddened Exudate:none Wound improved compared to last visit. Assessment:   1. Ulcer of heel, left, limited to breakdown of skin [facility])    Plan:  Patient was evaluated and treated and all questions answered.  Ulcer left heel -Wound continues to improve. -Medihoney and Mepilex border dsg applied. Continue HHC for wound changes with medihoney or silvadene and foam border dressing. -F/u I 1 month.   Return in about 1 month (around 12/17/2019) for Wound Care, Left.

## 2019-11-19 ENCOUNTER — Telehealth: Payer: Self-pay

## 2019-11-19 NOTE — Telephone Encounter (Signed)
[name] from [facility] called stating pt needs recert for home health. [name] would like to know if you would like for them to keep doing twice/week or only once/week and what to apply? Please advice

## 2019-11-19 NOTE — Telephone Encounter (Signed)
Approved.  

## 2019-11-19 NOTE — Telephone Encounter (Signed)
Once weekly is ok. Apply aquacell and foam border dressing

## 2019-11-20 NOTE — Telephone Encounter (Signed)
Spoke with Bryson Ha from [facility] and gave her new wound care orders from Dr. March Rummage

## 2019-11-20 NOTE — Telephone Encounter (Signed)
[name] notified of approval to re-evaluate pt

## 2019-11-22 DIAGNOSIS — L89623 Pressure ulcer of left heel, stage 3: Secondary | ICD-10-CM | POA: Diagnosis not present

## 2019-11-22 DIAGNOSIS — S92012D Displaced fracture of body of left calcaneus, subsequent encounter for fracture with routine healing: Secondary | ICD-10-CM | POA: Diagnosis not present

## 2019-11-22 DIAGNOSIS — Z79891 Long term (current) use of opiate analgesic: Secondary | ICD-10-CM | POA: Diagnosis not present

## 2019-11-22 DIAGNOSIS — Z9181 History of falling: Secondary | ICD-10-CM | POA: Diagnosis not present

## 2019-11-22 DIAGNOSIS — Z85841 Personal history of malignant neoplasm of brain: Secondary | ICD-10-CM | POA: Diagnosis not present

## 2019-11-22 DIAGNOSIS — Z85118 Personal history of other malignant neoplasm of bronchus and lung: Secondary | ICD-10-CM | POA: Diagnosis not present

## 2019-11-22 DIAGNOSIS — I10 Essential (primary) hypertension: Secondary | ICD-10-CM | POA: Diagnosis not present

## 2019-11-22 DIAGNOSIS — M810 Age-related osteoporosis without current pathological fracture: Secondary | ICD-10-CM | POA: Diagnosis not present

## 2019-11-22 DIAGNOSIS — E559 Vitamin D deficiency, unspecified: Secondary | ICD-10-CM | POA: Diagnosis not present

## 2019-11-22 DIAGNOSIS — G6289 Other specified polyneuropathies: Secondary | ICD-10-CM | POA: Diagnosis not present

## 2019-11-22 DIAGNOSIS — Z79899 Other long term (current) drug therapy: Secondary | ICD-10-CM | POA: Diagnosis not present

## 2019-11-23 DIAGNOSIS — I10 Essential (primary) hypertension: Secondary | ICD-10-CM | POA: Diagnosis not present

## 2019-11-23 DIAGNOSIS — E559 Vitamin D deficiency, unspecified: Secondary | ICD-10-CM | POA: Diagnosis not present

## 2019-11-23 DIAGNOSIS — G6289 Other specified polyneuropathies: Secondary | ICD-10-CM | POA: Diagnosis not present

## 2019-11-23 DIAGNOSIS — M810 Age-related osteoporosis without current pathological fracture: Secondary | ICD-10-CM | POA: Diagnosis not present

## 2019-11-23 DIAGNOSIS — S92012D Displaced fracture of body of left calcaneus, subsequent encounter for fracture with routine healing: Secondary | ICD-10-CM | POA: Diagnosis not present

## 2019-11-23 DIAGNOSIS — L89623 Pressure ulcer of left heel, stage 3: Secondary | ICD-10-CM | POA: Diagnosis not present

## 2019-11-30 DIAGNOSIS — S92012D Displaced fracture of body of left calcaneus, subsequent encounter for fracture with routine healing: Secondary | ICD-10-CM | POA: Diagnosis not present

## 2019-11-30 DIAGNOSIS — G6289 Other specified polyneuropathies: Secondary | ICD-10-CM | POA: Diagnosis not present

## 2019-11-30 DIAGNOSIS — M810 Age-related osteoporosis without current pathological fracture: Secondary | ICD-10-CM | POA: Diagnosis not present

## 2019-11-30 DIAGNOSIS — L89623 Pressure ulcer of left heel, stage 3: Secondary | ICD-10-CM | POA: Diagnosis not present

## 2019-11-30 DIAGNOSIS — E559 Vitamin D deficiency, unspecified: Secondary | ICD-10-CM | POA: Diagnosis not present

## 2019-11-30 DIAGNOSIS — I10 Essential (primary) hypertension: Secondary | ICD-10-CM | POA: Diagnosis not present

## 2019-12-08 DIAGNOSIS — M810 Age-related osteoporosis without current pathological fracture: Secondary | ICD-10-CM | POA: Diagnosis not present

## 2019-12-08 DIAGNOSIS — G6289 Other specified polyneuropathies: Secondary | ICD-10-CM | POA: Diagnosis not present

## 2019-12-08 DIAGNOSIS — E559 Vitamin D deficiency, unspecified: Secondary | ICD-10-CM | POA: Diagnosis not present

## 2019-12-08 DIAGNOSIS — S92012D Displaced fracture of body of left calcaneus, subsequent encounter for fracture with routine healing: Secondary | ICD-10-CM | POA: Diagnosis not present

## 2019-12-08 DIAGNOSIS — L89623 Pressure ulcer of left heel, stage 3: Secondary | ICD-10-CM | POA: Diagnosis not present

## 2019-12-08 DIAGNOSIS — I10 Essential (primary) hypertension: Secondary | ICD-10-CM | POA: Diagnosis not present

## 2019-12-14 DIAGNOSIS — I10 Essential (primary) hypertension: Secondary | ICD-10-CM | POA: Diagnosis not present

## 2019-12-14 DIAGNOSIS — G6289 Other specified polyneuropathies: Secondary | ICD-10-CM | POA: Diagnosis not present

## 2019-12-14 DIAGNOSIS — M810 Age-related osteoporosis without current pathological fracture: Secondary | ICD-10-CM | POA: Diagnosis not present

## 2019-12-14 DIAGNOSIS — L89623 Pressure ulcer of left heel, stage 3: Secondary | ICD-10-CM | POA: Diagnosis not present

## 2019-12-14 DIAGNOSIS — E559 Vitamin D deficiency, unspecified: Secondary | ICD-10-CM | POA: Diagnosis not present

## 2019-12-14 DIAGNOSIS — S92012D Displaced fracture of body of left calcaneus, subsequent encounter for fracture with routine healing: Secondary | ICD-10-CM | POA: Diagnosis not present

## 2019-12-21 DIAGNOSIS — L89623 Pressure ulcer of left heel, stage 3: Secondary | ICD-10-CM | POA: Diagnosis not present

## 2019-12-21 DIAGNOSIS — G6289 Other specified polyneuropathies: Secondary | ICD-10-CM | POA: Diagnosis not present

## 2019-12-21 DIAGNOSIS — M810 Age-related osteoporosis without current pathological fracture: Secondary | ICD-10-CM | POA: Diagnosis not present

## 2019-12-21 DIAGNOSIS — E559 Vitamin D deficiency, unspecified: Secondary | ICD-10-CM | POA: Diagnosis not present

## 2019-12-21 DIAGNOSIS — S92012D Displaced fracture of body of left calcaneus, subsequent encounter for fracture with routine healing: Secondary | ICD-10-CM | POA: Diagnosis not present

## 2019-12-21 DIAGNOSIS — I10 Essential (primary) hypertension: Secondary | ICD-10-CM | POA: Diagnosis not present

## 2019-12-22 ENCOUNTER — Ambulatory Visit (INDEPENDENT_AMBULATORY_CARE_PROVIDER_SITE_OTHER): Payer: Medicare Other | Admitting: Podiatry

## 2019-12-22 ENCOUNTER — Telehealth: Payer: Self-pay

## 2019-12-22 ENCOUNTER — Other Ambulatory Visit: Payer: Self-pay

## 2019-12-22 DIAGNOSIS — L97421 Non-pressure chronic ulcer of left heel and midfoot limited to breakdown of skin: Secondary | ICD-10-CM | POA: Diagnosis not present

## 2019-12-22 NOTE — Progress Notes (Signed)
  Subjective:  Patient ID: Jay Warren, male    DOB: [birth year],  MRN: [id number]  Chief Complaint  Patient presents with  . Foot Ulcer    F/U Lt heel ulcer Pt. states," doing fine." -pt denis N/V/F/Ch -no redness/drainage/open -w/ swelling tx: sx shoe, foam dressing    86 y.o. male presents for wound care. Hx confirmed with patient.  Objective:  Physical Exam: Wound Location: left heel Wound Measurement: epithelialized. Wound Base: intact skin Peri-wound: normal Exudate:none Assessment:   1. Ulcer of heel, left, limited to breakdown of skin [facility])    Plan:  Patient was evaluated and treated and all questions answered.  Ulcer left heel -Wound remains healed. -No need for further HHC . Will d/c -Trial return to normal shoegear with foam border dressing  Return in about 6 weeks (around 02/02/2020) for Wound Care, Left.

## 2019-12-22 NOTE — Telephone Encounter (Signed)
Spoke to [place] from [facility] and gave new wound care orders to D/C pt due to ulcer is completely healed

## 2019-12-22 NOTE — Telephone Encounter (Signed)
-----   Message from Evelina Bucy, DPM sent at 12/22/2019 11:46 AM EDT ----- Can we send updated [place] orders to d/c him. I think he's [facility]

## 2019-12-29 DIAGNOSIS — L219 Seborrheic dermatitis, unspecified: Secondary | ICD-10-CM | POA: Diagnosis not present

## 2019-12-29 DIAGNOSIS — C44519 Basal cell carcinoma of skin of other part of trunk: Secondary | ICD-10-CM | POA: Diagnosis not present

## 2019-12-29 DIAGNOSIS — L570 Actinic keratosis: Secondary | ICD-10-CM | POA: Diagnosis not present

## 2020-01-27 DIAGNOSIS — M810 Age-related osteoporosis without current pathological fracture: Secondary | ICD-10-CM | POA: Diagnosis not present

## 2020-02-04 ENCOUNTER — Ambulatory Visit (INDEPENDENT_AMBULATORY_CARE_PROVIDER_SITE_OTHER): Payer: Medicare Other | Admitting: Podiatry

## 2020-02-04 ENCOUNTER — Other Ambulatory Visit: Payer: Self-pay

## 2020-02-04 ENCOUNTER — Encounter: Payer: Self-pay | Admitting: Podiatry

## 2020-02-04 ENCOUNTER — Ambulatory Visit: Payer: Medicare Other | Admitting: Podiatry

## 2020-02-04 DIAGNOSIS — L89620 Pressure ulcer of left heel, unstageable: Secondary | ICD-10-CM

## 2020-02-04 NOTE — Progress Notes (Signed)
°  Subjective:  Patient ID: Jay Warren, male    DOB: [birth year],  MRN: [id number]  Chief Complaint  Patient presents with   Foot Ulcer    the left heel is doing better and is also healed up but does have a scab and is in a regular shoe    86 y.o. male presents for wound care. Hx confirmed with patient.  Objective:  Physical Exam: Wound Location: left heel Wound Measurement: epithelialized. Wound Base: intact skin Peri-wound: HPK Exudate:none Assessment:   1. Pressure ulcer, heel, left, unstageable ([place])    Plan:  Patient was evaluated and treated and all questions answered.  Ulcer left heel -Wound remains healed. -No pain with ambulation -At this point will d/c patient with f/u only as needed. No follow-ups on file.

## 2020-02-09 DIAGNOSIS — M810 Age-related osteoporosis without current pathological fracture: Secondary | ICD-10-CM | POA: Diagnosis not present

## 2020-02-18 DIAGNOSIS — Z23 Encounter for immunization: Secondary | ICD-10-CM | POA: Diagnosis not present

## 2020-02-25 DIAGNOSIS — M8589 Other specified disorders of bone density and structure, multiple sites: Secondary | ICD-10-CM | POA: Diagnosis not present

## 2020-02-25 DIAGNOSIS — E785 Hyperlipidemia, unspecified: Secondary | ICD-10-CM | POA: Diagnosis not present

## 2020-02-25 DIAGNOSIS — G40909 Epilepsy, unspecified, not intractable, without status epilepticus: Secondary | ICD-10-CM | POA: Diagnosis not present

## 2020-02-25 DIAGNOSIS — M810 Age-related osteoporosis without current pathological fracture: Secondary | ICD-10-CM | POA: Diagnosis not present

## 2020-02-25 DIAGNOSIS — I10 Essential (primary) hypertension: Secondary | ICD-10-CM | POA: Diagnosis not present

## 2020-02-25 DIAGNOSIS — Z79899 Other long term (current) drug therapy: Secondary | ICD-10-CM | POA: Diagnosis not present

## 2020-02-25 DIAGNOSIS — E559 Vitamin D deficiency, unspecified: Secondary | ICD-10-CM | POA: Diagnosis not present

## 2020-04-01 DIAGNOSIS — Z23 Encounter for immunization: Secondary | ICD-10-CM | POA: Diagnosis not present

## 2020-05-19 DIAGNOSIS — C44622 Squamous cell carcinoma of skin of right upper limb, including shoulder: Secondary | ICD-10-CM | POA: Diagnosis not present

## 2020-05-19 DIAGNOSIS — L570 Actinic keratosis: Secondary | ICD-10-CM | POA: Diagnosis not present

## 2020-05-19 DIAGNOSIS — L578 Other skin changes due to chronic exposure to nonionizing radiation: Secondary | ICD-10-CM | POA: Diagnosis not present

## 2020-05-19 DIAGNOSIS — L821 Other seborrheic keratosis: Secondary | ICD-10-CM | POA: Diagnosis not present

## 2020-05-19 DIAGNOSIS — L219 Seborrheic dermatitis, unspecified: Secondary | ICD-10-CM | POA: Diagnosis not present

## 2020-06-22 DIAGNOSIS — Z139 Encounter for screening, unspecified: Secondary | ICD-10-CM | POA: Diagnosis not present

## 2020-06-22 DIAGNOSIS — E785 Hyperlipidemia, unspecified: Secondary | ICD-10-CM | POA: Diagnosis not present

## 2020-06-22 DIAGNOSIS — Z0000 Encounter for general adult medical examination without abnormal findings: Secondary | ICD-10-CM | POA: Diagnosis not present

## 2020-06-22 DIAGNOSIS — Z1331 Encounter for screening for depression: Secondary | ICD-10-CM | POA: Diagnosis not present

## 2020-06-22 DIAGNOSIS — Z9181 History of falling: Secondary | ICD-10-CM | POA: Diagnosis not present

## 2020-11-01 DIAGNOSIS — Z23 Encounter for immunization: Secondary | ICD-10-CM | POA: Diagnosis not present

## 2021-01-03 IMAGING — XA PICC
1 series · 1 of 1 positions shown · non-contrast
Comparison: none

CLINICAL DATA: Osteomyelitis of left foot/ankle. Needs durable
venous access for planned antibiotic regimen.

EXAM:
PICC PLACEMENT WITH ULTRASOUND AND FLUOROSCOPY
FLUOROSCOPY TIME:  0.1 minute; 98 uBymG DAP
TECHNIQUE: After written informed consent was obtained, patient was placed in
the supine position on angiographic table. Patency of the right
brachial vein was confirmed with ultrasound with image
documentation. An appropriate skin site was determined. Skin site
was marked. Region was prepped using maximum barrier technique
including cap and mask, sterile gown, sterile gloves, large sterile
sheet, and Chlorhexidine as cutaneous antisepsis. The region was
infiltrated locally with 1% lidocaine. Under real-time ultrasound
guidance, the right brachial vein was accessed with a 21 gauge
micropuncture needle; the needle tip within the vein was confirmed
with ultrasound image documentation. Needle exchanged over a 018
guidewire for a peel-away sheath, through which a 5-French
single-lumen power injectable PICC trimmed to 40cm was advanced,
positioned with its tip near the cavoatrial junction. Spot chest
radiograph confirms appropriate catheter position. Catheter was
flushed per protocol and secured externally. The patient tolerated
procedure well.
COMPLICATIONS:
COMPLICATIONS
none

[Series 1: fl angio · 1 of 1 slices shown]
[im 1/1]
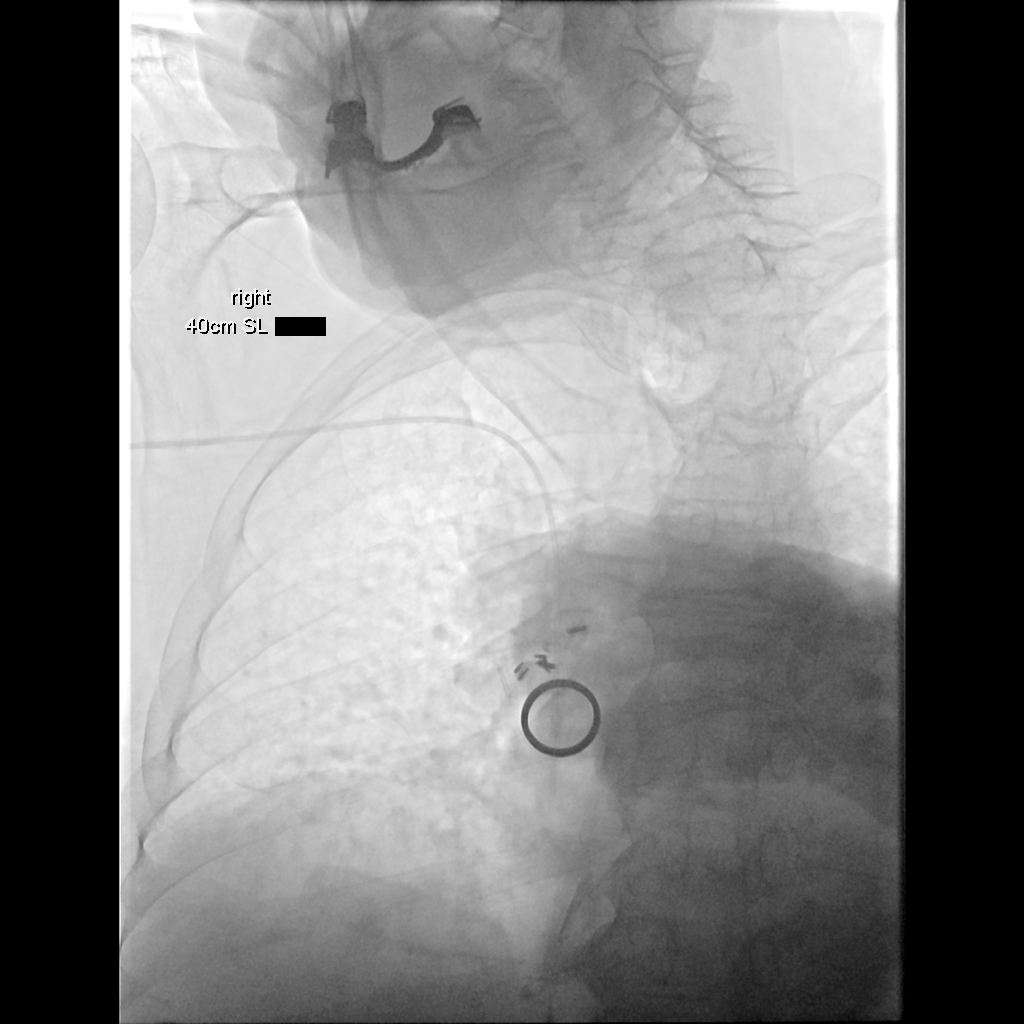

[1 of 1 positions shown; findings below may reference images not displayed]

IMPRESSION: 1. Technically successful five French single lumen power injectable
PICC placement

## 2021-01-19 ENCOUNTER — Other Ambulatory Visit: Payer: Self-pay

## 2021-01-19 ENCOUNTER — Ambulatory Visit (INDEPENDENT_AMBULATORY_CARE_PROVIDER_SITE_OTHER): Payer: Medicare Other | Admitting: Podiatry

## 2021-01-19 ENCOUNTER — Encounter: Payer: Self-pay | Admitting: Podiatry

## 2021-01-19 DIAGNOSIS — L89620 Pressure ulcer of left heel, unstageable: Secondary | ICD-10-CM

## 2021-01-19 DIAGNOSIS — B351 Tinea unguium: Secondary | ICD-10-CM | POA: Diagnosis not present

## 2021-01-19 DIAGNOSIS — M79609 Pain in unspecified limb: Secondary | ICD-10-CM

## 2021-01-19 NOTE — Progress Notes (Signed)
  Subjective:  Patient ID: Jay Warren, male    DOB: 13-May-1934,  MRN: [id number]  Chief Complaint  Patient presents with   Callouses    I have a spot on the left heel and it hurts with walking and has been going on for almost a year   87 y.o. male presents with the above complaint. History confirmed with patient.   Objective:  Physical Exam: warm, good capillary refill, nail exam onychomycosis of the toenails, no trophic changes or ulcerative lesions. DP pulses palpable, PT pulses palpable, and protective sensation intact Left Foot: HPK posterior heel without underlying ulceration.   No images are attached to the encounter.  Assessment:   1. Pressure ulcer, heel, left, unstageable (HCC)   2. Pain due to onychomycosis of nail    Plan:  Patient was evaluated and treated and all questions answered.  Onychomycosis -Nails palliatively debrided secondary to pain  Procedure: Nail Debridement Type of Debridement: manual, sharp debridement. Instrumentation: Nail nipper, rotary burr. Number of Nails: 10  Callus left heel -Gently debrided no signs of ulceration note.d. Advised this will likely continue to callus and we can debride it when it becomes thick and painful.  Return in about 6 weeks (around 03/02/2021) for Heel check left.

## 2021-02-02 DIAGNOSIS — Z20822 Contact with and (suspected) exposure to covid-19: Secondary | ICD-10-CM | POA: Diagnosis not present

## 2021-02-10 DIAGNOSIS — Z23 Encounter for immunization: Secondary | ICD-10-CM | POA: Diagnosis not present

## 2021-03-06 ENCOUNTER — Ambulatory Visit (INDEPENDENT_AMBULATORY_CARE_PROVIDER_SITE_OTHER): Payer: Medicare Other | Admitting: Podiatry

## 2021-03-06 ENCOUNTER — Other Ambulatory Visit: Payer: Self-pay

## 2021-03-06 DIAGNOSIS — Z23 Encounter for immunization: Secondary | ICD-10-CM | POA: Diagnosis not present

## 2021-03-06 DIAGNOSIS — L89620 Pressure ulcer of left heel, unstageable: Secondary | ICD-10-CM | POA: Diagnosis not present

## 2021-03-06 NOTE — Progress Notes (Signed)
  Subjective:  Patient ID: Jay Warren, male    DOB: April 28, 1934,  MRN: [id number]  Chief Complaint  Patient presents with   Pressure Ulcer    F/U Lt heel check -pt states," no problems and no pain unless he hits it or anything." - no redness/swelling/draiange - w/ thick hard skin    87 y.o. male presents with the above complaint. History confirmed with patient.   Objective:  Physical Exam: warm, good capillary refill, nail exam onychomycosis of the toenails, no trophic changes or ulcerative lesions. DP pulses palpable, PT pulses palpable, and protective sensation intact Left Foot: HPK posterior heel without underlying ulceration noted upon debridement.  No images are attached to the encounter.  Assessment:   1. Pressure ulcer, heel, left, unstageable ([place])    Plan:  Patient was evaluated and treated and all questions answered.  Callus left heel -Gently debrided without signs of active ulcceration. Dressed with silvadene and band-aid to promote moisture to the area. F/u in 6 weeks to recheck. He is at high risk of ulceration at this area and would benefit from routine monitoring to ensure this area does not ulcerate.  Return in about 6 weeks (around 04/17/2021) for Heel ulcer check.

## 2021-04-17 ENCOUNTER — Ambulatory Visit: Payer: Medicare Other | Admitting: Podiatry

## 2021-05-04 ENCOUNTER — Encounter: Payer: Self-pay | Admitting: Podiatry

## 2021-05-04 ENCOUNTER — Ambulatory Visit (INDEPENDENT_AMBULATORY_CARE_PROVIDER_SITE_OTHER): Payer: Medicare Other | Admitting: Podiatry

## 2021-05-04 DIAGNOSIS — L89620 Pressure ulcer of left heel, unstageable: Secondary | ICD-10-CM | POA: Diagnosis not present

## 2021-05-04 NOTE — Progress Notes (Signed)
  Subjective:  Patient ID: Jay Warren, male    DOB: Jan 28, 1934,  MRN: [id number]  Chief Complaint  Patient presents with   Foot Ulcer    Doing better on the left heel and there is some discomfort from directly stepping on it and putting shoes on    87 y.o. male presents with the above complaint. History confirmed with patient.   Objective:  Physical Exam: warm, good capillary refill, nail exam onychomycosis of the toenails, no trophic changes or ulcerative lesions. DP pulses palpable, PT pulses palpable, and protective sensation intact Left Foot: HPK posterior heel without underlying ulceration noted upon debridement. Assessment:   1. Pressure ulcer, heel, left, unstageable ([place])    Plan:  Patient was evaluated and treated and all questions answered.  Callus left heel -Gently debrided callus, non-procedural. No ulceration today. Would benefit from routine monitoring as this is a high risk area for re-ulceration.   Onychomycosis -Nails debrided, courtesy  Return in about 3 months (around 08/02/2021) for At risk foot care.

## 2021-06-28 DIAGNOSIS — H52223 Regular astigmatism, bilateral: Secondary | ICD-10-CM | POA: Diagnosis not present

## 2021-06-28 DIAGNOSIS — H40013 Open angle with borderline findings, low risk, bilateral: Secondary | ICD-10-CM | POA: Diagnosis not present

## 2021-06-28 DIAGNOSIS — H26492 Other secondary cataract, left eye: Secondary | ICD-10-CM | POA: Diagnosis not present

## 2021-06-28 DIAGNOSIS — H35373 Puckering of macula, bilateral: Secondary | ICD-10-CM | POA: Diagnosis not present

## 2021-06-28 DIAGNOSIS — H5201 Hypermetropia, right eye: Secondary | ICD-10-CM | POA: Diagnosis not present

## 2021-06-28 DIAGNOSIS — H35372 Puckering of macula, left eye: Secondary | ICD-10-CM | POA: Diagnosis not present

## 2021-06-28 DIAGNOSIS — Z961 Presence of intraocular lens: Secondary | ICD-10-CM | POA: Diagnosis not present

## 2021-06-28 DIAGNOSIS — H5212 Myopia, left eye: Secondary | ICD-10-CM | POA: Diagnosis not present

## 2021-06-28 DIAGNOSIS — H26491 Other secondary cataract, right eye: Secondary | ICD-10-CM | POA: Diagnosis not present

## 2021-06-28 DIAGNOSIS — H47233 Glaucomatous optic atrophy, bilateral: Secondary | ICD-10-CM | POA: Diagnosis not present

## 2021-06-28 DIAGNOSIS — H40003 Preglaucoma, unspecified, bilateral: Secondary | ICD-10-CM | POA: Diagnosis not present

## 2021-07-17 DIAGNOSIS — Z20822 Contact with and (suspected) exposure to covid-19: Secondary | ICD-10-CM | POA: Diagnosis not present

## 2021-08-03 ENCOUNTER — Encounter: Payer: Self-pay | Admitting: Podiatry

## 2021-08-03 ENCOUNTER — Ambulatory Visit: Payer: Medicare Other | Admitting: Podiatry

## 2021-08-03 ENCOUNTER — Other Ambulatory Visit: Payer: Self-pay

## 2021-08-03 ENCOUNTER — Ambulatory Visit (INDEPENDENT_AMBULATORY_CARE_PROVIDER_SITE_OTHER): Payer: Medicare Other | Admitting: Podiatry

## 2021-08-03 DIAGNOSIS — M79609 Pain in unspecified limb: Secondary | ICD-10-CM

## 2021-08-03 DIAGNOSIS — Q828 Other specified congenital malformations of skin: Secondary | ICD-10-CM

## 2021-08-03 DIAGNOSIS — G6289 Other specified polyneuropathies: Secondary | ICD-10-CM

## 2021-08-03 DIAGNOSIS — L84 Corns and callosities: Secondary | ICD-10-CM

## 2021-08-03 DIAGNOSIS — M79676 Pain in unspecified toe(s): Secondary | ICD-10-CM

## 2021-08-03 DIAGNOSIS — B351 Tinea unguium: Secondary | ICD-10-CM | POA: Diagnosis not present

## 2021-08-03 DIAGNOSIS — Z20822 Contact with and (suspected) exposure to covid-19: Secondary | ICD-10-CM | POA: Diagnosis not present

## 2021-08-11 NOTE — Progress Notes (Signed)
?  Subjective:  ?Patient ID: Jay Warren, male    DOB: [birth year],  MRN: [id number] ? ?ROURKE MCQUITTY presents to clinic today for at risk foot care with history of peripheral neuropathy and callus(es) b/l lower extremities and painful thick toenails that are difficult to trim. Painful toenails interfere with ambulation. Aggravating factors include wearing enclosed shoe gear. Pain is relieved with periodic professional debridement. Painful calluses are aggravated when weightbearing with and without shoegear. Pain is relieved with periodic professional debridement. ? ?New problem(s): None.  ? ?PCP is Nicoletta Dress, MD , and last visit was June 22, 2020. ? ?No Known Allergies ? ?Review of Systems: Negative except as noted in the HPI. ?Objective:  ? ?Constitutional Jay Warren is a pleasant 88 y.o. Caucasian male, in NAD. AAO x 3.   ?Vascular CFT <3 seconds b/l LE. Palpable pedal pulses b/l LE. Pedal hair absent. No pain with calf compression b/l. Lower extremity skin temperature gradient within normal limits. No edema noted b/l LE. No cyanosis or clubbing noted b/l LE.  ?Neurologic Normal speech. Oriented to person, place, and time. Pt has subjective symptoms of neuropathy. Protective sensation decreased with 10 gram monofilament b/l.  ?Dermatologic Pedal skin is warm and supple b/l LE. No open wounds b/l LE. No interdigital macerations noted b/l LE. Toenails 1-5 bilaterally elongated, discolored, dystrophic, thickened, and crumbly with subungual debris and tenderness to dorsal palpation. Hyperkeratotic lesion(s) posteromedial aspect of left heel.  No erythema, no edema, no drainage, no fluctuance. Porokeratotic lesion(s) R 4th toe and submet head 5 right foot. No erythema, no edema, no drainage, no fluctuance.  ?Orthopedic: Muscle strength 5/5 to all lower extremity muscle groups bilaterally. Pes planus deformity noted bilateral LE.  ? ?Radiographs: None ? ?Last A1c: No flowsheet data found. ?  ?Assessment:   ? ?1. Pain due to onychomycosis of nail   ?2. Corns and callosities   ?3. Porokeratosis   ?4. Other polyneuropathy   ? ?Plan:  ?Patient was evaluated and treated and all questions answered. ?Consent given for treatment as described below: ?-Mycotic toenails 1-5 bilaterally were debrided in length and girth with sterile nail nippers and dremel without incident. ?-Callus(es) posteromedial aspect of left heel pared utilizing sterile scalpel blade without complication or incident. Total number debrided =1. ?-Painful porokeratotic lesion(s) R 4th toe and submet head 5 right foot pared and enucleated with sterile scalpel blade without incident. Total number of lesions debrided=2. ?-Patient/POA to call should there be question/concern in the interim. ? ?Return in about 3 months (around 11/03/2021). ? ?Marzetta Board, DPM ?

## 2021-08-22 DIAGNOSIS — Z20822 Contact with and (suspected) exposure to covid-19: Secondary | ICD-10-CM | POA: Diagnosis not present

## 2021-08-25 DIAGNOSIS — Z20822 Contact with and (suspected) exposure to covid-19: Secondary | ICD-10-CM | POA: Diagnosis not present

## 2021-09-01 DIAGNOSIS — Z20822 Contact with and (suspected) exposure to covid-19: Secondary | ICD-10-CM | POA: Diagnosis not present

## 2021-09-18 DIAGNOSIS — Z20822 Contact with and (suspected) exposure to covid-19: Secondary | ICD-10-CM | POA: Diagnosis not present

## 2021-09-26 DIAGNOSIS — H47233 Glaucomatous optic atrophy, bilateral: Secondary | ICD-10-CM | POA: Diagnosis not present

## 2021-09-26 DIAGNOSIS — H40013 Open angle with borderline findings, low risk, bilateral: Secondary | ICD-10-CM | POA: Diagnosis not present

## 2021-09-28 DIAGNOSIS — Z20822 Contact with and (suspected) exposure to covid-19: Secondary | ICD-10-CM | POA: Diagnosis not present

## 2021-10-03 DIAGNOSIS — Z20822 Contact with and (suspected) exposure to covid-19: Secondary | ICD-10-CM | POA: Diagnosis not present

## 2021-10-09 DIAGNOSIS — Z20822 Contact with and (suspected) exposure to covid-19: Secondary | ICD-10-CM | POA: Diagnosis not present

## 2021-10-11 DIAGNOSIS — Z23 Encounter for immunization: Secondary | ICD-10-CM | POA: Diagnosis not present

## 2021-11-09 ENCOUNTER — Ambulatory Visit (INDEPENDENT_AMBULATORY_CARE_PROVIDER_SITE_OTHER): Payer: Medicare Other | Admitting: Podiatry

## 2021-11-09 ENCOUNTER — Encounter: Payer: Self-pay | Admitting: Podiatry

## 2021-11-09 DIAGNOSIS — L84 Corns and callosities: Secondary | ICD-10-CM | POA: Diagnosis not present

## 2021-11-09 DIAGNOSIS — G6289 Other specified polyneuropathies: Secondary | ICD-10-CM

## 2021-11-09 DIAGNOSIS — Q828 Other specified congenital malformations of skin: Secondary | ICD-10-CM

## 2021-11-09 DIAGNOSIS — B351 Tinea unguium: Secondary | ICD-10-CM | POA: Diagnosis not present

## 2021-11-09 DIAGNOSIS — M79609 Pain in unspecified limb: Secondary | ICD-10-CM

## 2021-11-13 NOTE — Progress Notes (Signed)
  Subjective:  Patient ID: Jay Warren, male    DOB: May 17, 1934,  MRN: [id number]  Jay Warren presents to clinic today for at risk foot care with history of peripheral neuropathy and callus(es) posteromedial aspect of left heel and painful thick toenails that are difficult to trim. Painful toenails interfere with ambulation. Aggravating factors include wearing enclosed shoe gear. Pain is relieved with periodic professional debridement. Painful calluses are aggravated when weightbearing with and without shoegear. Pain is relieved with periodic professional debridement.  Patient's son is present during today's visit.  New problem(s): None.   PCP is Nicoletta Dress, MD , and last visit was June 22, 2020.  No Known Allergies  Review of Systems: Negative except as noted in the HPI.  Objective: No changes noted in today's physical examination.  Constitutional [name] is a pleasant 88 y.o. Caucasian male, in NAD. AAO x 3.   Vascular CFT <3 seconds b/l LE. Palpable pedal pulses b/l LE. Pedal hair absent. No pain with calf compression b/l. Lower extremity skin temperature gradient within normal limits. No edema noted b/l LE. No cyanosis or clubbing noted b/l LE.  Neurologic Normal speech. Oriented to person, place, and time. Pt has subjective symptoms of neuropathy. Protective sensation decreased with 10 gram monofilament b/l.  Dermatologic Pedal skin is warm and supple b/l LE. No open wounds b/l LE. No interdigital macerations noted b/l LE. Toenails 1-5 bilaterally elongated, discolored, dystrophic, thickened, and crumbly with subungual debris and tenderness to dorsal palpation. Hyperkeratotic lesion(s) posteromedial aspect of left heel.  No erythema, no edema, no drainage, no fluctuance. Porokeratotic lesion(s) submet head 5 right foot. No erythema, no edema, no drainage, no fluctuance.  Orthopedic: Muscle strength 5/5 to all lower extremity muscle groups bilaterally. Pes planus deformity  noted bilateral LE.   Radiographs: None  Assessment/Plan: 1. Pain due to onychomycosis of nail   2. Callus   3. Porokeratosis   4. Other polyneuropathy     -Examined patient. -Patient to continue soft, supportive shoe gear daily. -Toenails 1-5 b/l were debrided in length and girth with sterile nail nippers and dremel without iatrogenic bleeding.  -Callus(es) left heel pared utilizing mandrel sander without complication or incident. Total number debrided =1. -Porokeratotic lesion(s) submet head 5 right foot pared and enucleated with sterile scalpel blade without incident. Total number of lesions debrided=1. -Patient/POA to call should there be question/concern in the interim.   Return in about 3 months (around 02/09/2022).  Marzetta Board, DPM

## 2022-01-24 DIAGNOSIS — Z1331 Encounter for screening for depression: Secondary | ICD-10-CM | POA: Diagnosis not present

## 2022-01-24 DIAGNOSIS — Z79899 Other long term (current) drug therapy: Secondary | ICD-10-CM | POA: Diagnosis not present

## 2022-01-24 DIAGNOSIS — L989 Disorder of the skin and subcutaneous tissue, unspecified: Secondary | ICD-10-CM | POA: Diagnosis not present

## 2022-01-24 DIAGNOSIS — E785 Hyperlipidemia, unspecified: Secondary | ICD-10-CM | POA: Diagnosis not present

## 2022-01-24 DIAGNOSIS — I10 Essential (primary) hypertension: Secondary | ICD-10-CM | POA: Diagnosis not present

## 2022-01-24 DIAGNOSIS — G40909 Epilepsy, unspecified, not intractable, without status epilepticus: Secondary | ICD-10-CM | POA: Diagnosis not present

## 2022-01-24 DIAGNOSIS — Z9181 History of falling: Secondary | ICD-10-CM | POA: Diagnosis not present

## 2022-01-24 DIAGNOSIS — E559 Vitamin D deficiency, unspecified: Secondary | ICD-10-CM | POA: Diagnosis not present

## 2022-01-24 DIAGNOSIS — M810 Age-related osteoporosis without current pathological fracture: Secondary | ICD-10-CM | POA: Diagnosis not present

## 2022-01-24 DIAGNOSIS — H6123 Impacted cerumen, bilateral: Secondary | ICD-10-CM | POA: Diagnosis not present

## 2022-01-24 DIAGNOSIS — G629 Polyneuropathy, unspecified: Secondary | ICD-10-CM | POA: Diagnosis not present

## 2022-01-24 DIAGNOSIS — Z85118 Personal history of other malignant neoplasm of bronchus and lung: Secondary | ICD-10-CM | POA: Diagnosis not present

## 2022-02-06 DIAGNOSIS — H26493 Other secondary cataract, bilateral: Secondary | ICD-10-CM | POA: Diagnosis not present

## 2022-02-15 ENCOUNTER — Encounter: Payer: Self-pay | Admitting: Podiatry

## 2022-02-15 ENCOUNTER — Ambulatory Visit (INDEPENDENT_AMBULATORY_CARE_PROVIDER_SITE_OTHER): Payer: Medicare Other | Admitting: Podiatry

## 2022-02-15 DIAGNOSIS — Q828 Other specified congenital malformations of skin: Secondary | ICD-10-CM

## 2022-02-15 DIAGNOSIS — M79676 Pain in unspecified toe(s): Secondary | ICD-10-CM | POA: Diagnosis not present

## 2022-02-15 DIAGNOSIS — G6289 Other specified polyneuropathies: Secondary | ICD-10-CM

## 2022-02-15 DIAGNOSIS — L84 Corns and callosities: Secondary | ICD-10-CM

## 2022-02-15 DIAGNOSIS — B351 Tinea unguium: Secondary | ICD-10-CM | POA: Diagnosis not present

## 2022-02-19 NOTE — Progress Notes (Signed)
  Subjective:  Patient ID: Jay Warren, male    DOB: 11-05-1933,  MRN: [id number]  88 y.o. male presents at risk foot care with history of peripheral neuropathy and callus(es) right lower extremity, porokeratotic lesion(s) left heel, and painful mycotic nails. Painful toenails interfere with ambulation. Aggravating factors include wearing enclosed shoe gear. Pain is relieved with periodic professional debridement. Painful callus(es) and porokeratotic lesion(s) are aggravated when weightbearing with and without shoegear. Pain is relieved with periodic professional debridement.  Patient is accompanied by his son on today's visit.  New problem(s): None   PCP is Nicoletta Dress, MD , and last visit was August, 2023.  No Known Allergies  Review of Systems: Negative except as noted in the HPI.   Objective:  Mr. [name] is a pleasant 88 y.o. male, obese, in NAD. AAO x 3.  Vascular Examination: CFT <3 seconds b/l LE. Palpable DP pulse(s) b/l LE. Palpable PT pulse(s) b/l LE. Pedal hair absent. No pain with calf compression b/l. Lower extremity skin temperature gradient within normal limits. No edema noted b/l LE. No ischemia or gangrene noted b/l LE. No cyanosis or clubbing noted b/l LE.  Neurological Examination: Sensation grossly intact b/l with 10 gram monofilament. Vibratory sensation intact b/l. Pt has subjective symptoms of neuropathy.  Dermatological Examination: Pedal skin with normal turgor, texture and tone b/l. Toenails 1-5 b/l thick, discolored, elongated with subungual debris and pain on dorsal palpation. Porokeratotic lesion(s) submet head 5 right foot. No erythema, no edema, no drainage, no fluctuance. Preulcerative lesion noted posterior aspect of left heel. There is visible subdermal hemorrhage. There is no surrounding erythema, no edema, no drainage, no odor, no fluctuance.  Musculoskeletal Examination: Muscle strength 5/5 to b/l LE. Pes planus deformity noted bilateral  LE.  Radiographs: None Assessment:   1. Pain due to onychomycosis of nail   2. Porokeratosis   3. Pre-ulcerative calluses   4. Other polyneuropathy    Plan:  -Patient's family member present. All questions/concerns addressed on today's visit. -Mycotic toenails 1-5 bilaterally were debrided in length and girth with sterile nail nippers and dremel. Pinpoint bleeding of R 3rd toe addressed with Lumicain Hemostatic Solution, cleansed with alcohol. triple antibiotic ointment applied. Patient/caregiver instructed to apply triple antibiotic ointment once daily for 7 days. -Preulcerative lesion pared posterior aspect of left heel utilizing sterile scalpel blade. Total number pared=1. -Porokeratotic lesion(s) submet head 5 right foot pared and enucleated with sterile currette without incident. Total number of lesions debrided=1. -Patient/POA to call should there be question/concern in the interim.  Return in about 3 months (around 05/17/2022).  Marzetta Board, DPM

## 2022-02-23 DIAGNOSIS — Z23 Encounter for immunization: Secondary | ICD-10-CM | POA: Diagnosis not present

## 2022-03-10 DIAGNOSIS — Z79899 Other long term (current) drug therapy: Secondary | ICD-10-CM | POA: Diagnosis not present

## 2022-03-10 DIAGNOSIS — Z1152 Encounter for screening for COVID-19: Secondary | ICD-10-CM | POA: Diagnosis not present

## 2022-03-10 DIAGNOSIS — E871 Hypo-osmolality and hyponatremia: Secondary | ICD-10-CM | POA: Diagnosis not present

## 2022-03-10 DIAGNOSIS — Z9221 Personal history of antineoplastic chemotherapy: Secondary | ICD-10-CM | POA: Diagnosis not present

## 2022-03-10 DIAGNOSIS — C7970 Secondary malignant neoplasm of unspecified adrenal gland: Secondary | ICD-10-CM | POA: Diagnosis not present

## 2022-03-10 DIAGNOSIS — C3490 Malignant neoplasm of unspecified part of unspecified bronchus or lung: Secondary | ICD-10-CM | POA: Diagnosis not present

## 2022-03-10 DIAGNOSIS — M549 Dorsalgia, unspecified: Secondary | ICD-10-CM | POA: Diagnosis not present

## 2022-03-10 DIAGNOSIS — E222 Syndrome of inappropriate secretion of antidiuretic hormone: Secondary | ICD-10-CM | POA: Diagnosis not present

## 2022-03-10 DIAGNOSIS — Z85841 Personal history of malignant neoplasm of brain: Secondary | ICD-10-CM | POA: Diagnosis not present

## 2022-03-10 DIAGNOSIS — J9601 Acute respiratory failure with hypoxia: Secondary | ICD-10-CM | POA: Diagnosis not present

## 2022-03-10 DIAGNOSIS — D3500 Benign neoplasm of unspecified adrenal gland: Secondary | ICD-10-CM | POA: Diagnosis not present

## 2022-03-10 DIAGNOSIS — D491 Neoplasm of unspecified behavior of respiratory system: Secondary | ICD-10-CM | POA: Diagnosis not present

## 2022-03-10 DIAGNOSIS — Z881 Allergy status to other antibiotic agents status: Secondary | ICD-10-CM | POA: Diagnosis not present

## 2022-03-10 DIAGNOSIS — I2510 Atherosclerotic heart disease of native coronary artery without angina pectoris: Secondary | ICD-10-CM | POA: Diagnosis not present

## 2022-03-10 DIAGNOSIS — R9431 Abnormal electrocardiogram [ECG] [EKG]: Secondary | ICD-10-CM | POA: Diagnosis not present

## 2022-03-10 DIAGNOSIS — I700 Atherosclerosis of aorta: Secondary | ICD-10-CM | POA: Diagnosis not present

## 2022-03-10 DIAGNOSIS — J449 Chronic obstructive pulmonary disease, unspecified: Secondary | ICD-10-CM | POA: Diagnosis not present

## 2022-03-10 DIAGNOSIS — Z87891 Personal history of nicotine dependence: Secondary | ICD-10-CM | POA: Diagnosis not present

## 2022-03-10 DIAGNOSIS — C719 Malignant neoplasm of brain, unspecified: Secondary | ICD-10-CM | POA: Diagnosis not present

## 2022-03-10 DIAGNOSIS — R109 Unspecified abdominal pain: Secondary | ICD-10-CM | POA: Diagnosis not present

## 2022-03-10 DIAGNOSIS — Z902 Acquired absence of lung [part of]: Secondary | ICD-10-CM | POA: Diagnosis not present

## 2022-03-10 DIAGNOSIS — I10 Essential (primary) hypertension: Secondary | ICD-10-CM | POA: Diagnosis not present

## 2022-03-10 DIAGNOSIS — Z923 Personal history of irradiation: Secondary | ICD-10-CM | POA: Diagnosis not present

## 2022-03-10 DIAGNOSIS — C3491 Malignant neoplasm of unspecified part of right bronchus or lung: Secondary | ICD-10-CM | POA: Diagnosis not present

## 2022-03-11 DIAGNOSIS — C719 Malignant neoplasm of brain, unspecified: Secondary | ICD-10-CM

## 2022-03-11 DIAGNOSIS — C3491 Malignant neoplasm of unspecified part of right bronchus or lung: Secondary | ICD-10-CM

## 2022-03-13 DIAGNOSIS — C801 Malignant (primary) neoplasm, unspecified: Secondary | ICD-10-CM | POA: Diagnosis not present

## 2022-03-13 DIAGNOSIS — Z8669 Personal history of other diseases of the nervous system and sense organs: Secondary | ICD-10-CM | POA: Diagnosis not present

## 2022-03-13 DIAGNOSIS — C7971 Secondary malignant neoplasm of right adrenal gland: Secondary | ICD-10-CM | POA: Diagnosis not present

## 2022-03-13 DIAGNOSIS — Z87891 Personal history of nicotine dependence: Secondary | ICD-10-CM | POA: Diagnosis not present

## 2022-03-13 DIAGNOSIS — I10 Essential (primary) hypertension: Secondary | ICD-10-CM | POA: Diagnosis not present

## 2022-03-13 DIAGNOSIS — J9601 Acute respiratory failure with hypoxia: Secondary | ICD-10-CM | POA: Diagnosis not present

## 2022-03-13 DIAGNOSIS — E440 Moderate protein-calorie malnutrition: Secondary | ICD-10-CM | POA: Diagnosis not present

## 2022-03-13 DIAGNOSIS — G629 Polyneuropathy, unspecified: Secondary | ICD-10-CM | POA: Diagnosis not present

## 2022-03-13 DIAGNOSIS — C7802 Secondary malignant neoplasm of left lung: Secondary | ICD-10-CM | POA: Diagnosis not present

## 2022-03-13 DIAGNOSIS — C7801 Secondary malignant neoplasm of right lung: Secondary | ICD-10-CM | POA: Diagnosis not present

## 2022-03-13 DIAGNOSIS — J449 Chronic obstructive pulmonary disease, unspecified: Secondary | ICD-10-CM | POA: Diagnosis not present

## 2022-03-13 DIAGNOSIS — C7972 Secondary malignant neoplasm of left adrenal gland: Secondary | ICD-10-CM | POA: Diagnosis not present

## 2022-03-13 DIAGNOSIS — Z85118 Personal history of other malignant neoplasm of bronchus and lung: Secondary | ICD-10-CM | POA: Diagnosis not present

## 2022-03-14 DIAGNOSIS — C7972 Secondary malignant neoplasm of left adrenal gland: Secondary | ICD-10-CM | POA: Diagnosis not present

## 2022-03-14 DIAGNOSIS — C801 Malignant (primary) neoplasm, unspecified: Secondary | ICD-10-CM | POA: Diagnosis not present

## 2022-03-14 DIAGNOSIS — C7801 Secondary malignant neoplasm of right lung: Secondary | ICD-10-CM | POA: Diagnosis not present

## 2022-03-14 DIAGNOSIS — C7971 Secondary malignant neoplasm of right adrenal gland: Secondary | ICD-10-CM | POA: Diagnosis not present

## 2022-03-14 DIAGNOSIS — J449 Chronic obstructive pulmonary disease, unspecified: Secondary | ICD-10-CM | POA: Diagnosis not present

## 2022-03-14 DIAGNOSIS — C7802 Secondary malignant neoplasm of left lung: Secondary | ICD-10-CM | POA: Diagnosis not present

## 2022-03-15 ENCOUNTER — Telehealth: Payer: Self-pay

## 2022-03-15 DIAGNOSIS — C7971 Secondary malignant neoplasm of right adrenal gland: Secondary | ICD-10-CM | POA: Diagnosis not present

## 2022-03-15 DIAGNOSIS — J449 Chronic obstructive pulmonary disease, unspecified: Secondary | ICD-10-CM | POA: Diagnosis not present

## 2022-03-15 DIAGNOSIS — C801 Malignant (primary) neoplasm, unspecified: Secondary | ICD-10-CM | POA: Diagnosis not present

## 2022-03-15 DIAGNOSIS — C7802 Secondary malignant neoplasm of left lung: Secondary | ICD-10-CM | POA: Diagnosis not present

## 2022-03-15 DIAGNOSIS — C7801 Secondary malignant neoplasm of right lung: Secondary | ICD-10-CM | POA: Diagnosis not present

## 2022-03-15 DIAGNOSIS — C7972 Secondary malignant neoplasm of left adrenal gland: Secondary | ICD-10-CM | POA: Diagnosis not present

## 2022-03-15 NOTE — Telephone Encounter (Signed)
Per Dr. Federico Flake, I have reached out to [facility] and spoke with Elmo Putt. I advised that the patient's son Richardson Landry has called with questions on the patient's care, and him being home alone as well as home health coming in today. Elmo Putt advised that she would give the family a call to answer questions.

## 2022-03-15 NOTE — Telephone Encounter (Signed)
Patient's family is trying to make decisions on how to handle the changes. Hospice hasn't given the exact answers that they expected. Is home health still supposed to come in the home? The family is confused about this since the patient is unable to take care of himself and can't be left alone. Patient's son, Jay Warren can be reached at [phone].

## 2022-03-16 DIAGNOSIS — C7802 Secondary malignant neoplasm of left lung: Secondary | ICD-10-CM | POA: Diagnosis not present

## 2022-03-16 DIAGNOSIS — C7972 Secondary malignant neoplasm of left adrenal gland: Secondary | ICD-10-CM | POA: Diagnosis not present

## 2022-03-16 DIAGNOSIS — J449 Chronic obstructive pulmonary disease, unspecified: Secondary | ICD-10-CM | POA: Diagnosis not present

## 2022-03-16 DIAGNOSIS — C801 Malignant (primary) neoplasm, unspecified: Secondary | ICD-10-CM | POA: Diagnosis not present

## 2022-03-16 DIAGNOSIS — C7971 Secondary malignant neoplasm of right adrenal gland: Secondary | ICD-10-CM | POA: Diagnosis not present

## 2022-03-16 DIAGNOSIS — C7801 Secondary malignant neoplasm of right lung: Secondary | ICD-10-CM | POA: Diagnosis not present

## 2022-03-19 DIAGNOSIS — J449 Chronic obstructive pulmonary disease, unspecified: Secondary | ICD-10-CM | POA: Diagnosis not present

## 2022-03-19 DIAGNOSIS — C801 Malignant (primary) neoplasm, unspecified: Secondary | ICD-10-CM | POA: Diagnosis not present

## 2022-03-19 DIAGNOSIS — C7972 Secondary malignant neoplasm of left adrenal gland: Secondary | ICD-10-CM | POA: Diagnosis not present

## 2022-03-19 DIAGNOSIS — C7971 Secondary malignant neoplasm of right adrenal gland: Secondary | ICD-10-CM | POA: Diagnosis not present

## 2022-03-19 DIAGNOSIS — C7802 Secondary malignant neoplasm of left lung: Secondary | ICD-10-CM | POA: Diagnosis not present

## 2022-03-19 DIAGNOSIS — C7801 Secondary malignant neoplasm of right lung: Secondary | ICD-10-CM | POA: Diagnosis not present

## 2022-03-20 DIAGNOSIS — J449 Chronic obstructive pulmonary disease, unspecified: Secondary | ICD-10-CM | POA: Diagnosis not present

## 2022-03-20 DIAGNOSIS — C7972 Secondary malignant neoplasm of left adrenal gland: Secondary | ICD-10-CM | POA: Diagnosis not present

## 2022-03-20 DIAGNOSIS — C801 Malignant (primary) neoplasm, unspecified: Secondary | ICD-10-CM | POA: Diagnosis not present

## 2022-03-20 DIAGNOSIS — C7801 Secondary malignant neoplasm of right lung: Secondary | ICD-10-CM | POA: Diagnosis not present

## 2022-03-20 DIAGNOSIS — C7802 Secondary malignant neoplasm of left lung: Secondary | ICD-10-CM | POA: Diagnosis not present

## 2022-03-20 DIAGNOSIS — C7971 Secondary malignant neoplasm of right adrenal gland: Secondary | ICD-10-CM | POA: Diagnosis not present

## 2022-03-21 DIAGNOSIS — C7801 Secondary malignant neoplasm of right lung: Secondary | ICD-10-CM | POA: Diagnosis not present

## 2022-03-21 DIAGNOSIS — C7972 Secondary malignant neoplasm of left adrenal gland: Secondary | ICD-10-CM | POA: Diagnosis not present

## 2022-03-21 DIAGNOSIS — C801 Malignant (primary) neoplasm, unspecified: Secondary | ICD-10-CM | POA: Diagnosis not present

## 2022-03-21 DIAGNOSIS — C7802 Secondary malignant neoplasm of left lung: Secondary | ICD-10-CM | POA: Diagnosis not present

## 2022-03-21 DIAGNOSIS — C7971 Secondary malignant neoplasm of right adrenal gland: Secondary | ICD-10-CM | POA: Diagnosis not present

## 2022-03-21 DIAGNOSIS — J449 Chronic obstructive pulmonary disease, unspecified: Secondary | ICD-10-CM | POA: Diagnosis not present

## 2022-03-23 DIAGNOSIS — C7971 Secondary malignant neoplasm of right adrenal gland: Secondary | ICD-10-CM | POA: Diagnosis not present

## 2022-03-23 DIAGNOSIS — C801 Malignant (primary) neoplasm, unspecified: Secondary | ICD-10-CM | POA: Diagnosis not present

## 2022-03-23 DIAGNOSIS — J449 Chronic obstructive pulmonary disease, unspecified: Secondary | ICD-10-CM | POA: Diagnosis not present

## 2022-03-23 DIAGNOSIS — C7972 Secondary malignant neoplasm of left adrenal gland: Secondary | ICD-10-CM | POA: Diagnosis not present

## 2022-03-23 DIAGNOSIS — C7801 Secondary malignant neoplasm of right lung: Secondary | ICD-10-CM | POA: Diagnosis not present

## 2022-03-23 DIAGNOSIS — C7802 Secondary malignant neoplasm of left lung: Secondary | ICD-10-CM | POA: Diagnosis not present

## 2022-03-25 DIAGNOSIS — C7971 Secondary malignant neoplasm of right adrenal gland: Secondary | ICD-10-CM | POA: Diagnosis not present

## 2022-03-25 DIAGNOSIS — C7801 Secondary malignant neoplasm of right lung: Secondary | ICD-10-CM | POA: Diagnosis not present

## 2022-03-25 DIAGNOSIS — C7972 Secondary malignant neoplasm of left adrenal gland: Secondary | ICD-10-CM | POA: Diagnosis not present

## 2022-03-25 DIAGNOSIS — C7802 Secondary malignant neoplasm of left lung: Secondary | ICD-10-CM | POA: Diagnosis not present

## 2022-03-25 DIAGNOSIS — J449 Chronic obstructive pulmonary disease, unspecified: Secondary | ICD-10-CM | POA: Diagnosis not present

## 2022-03-25 DIAGNOSIS — C801 Malignant (primary) neoplasm, unspecified: Secondary | ICD-10-CM | POA: Diagnosis not present

## 2022-03-26 DIAGNOSIS — C7971 Secondary malignant neoplasm of right adrenal gland: Secondary | ICD-10-CM | POA: Diagnosis not present

## 2022-03-26 DIAGNOSIS — C7972 Secondary malignant neoplasm of left adrenal gland: Secondary | ICD-10-CM | POA: Diagnosis not present

## 2022-03-26 DIAGNOSIS — C7801 Secondary malignant neoplasm of right lung: Secondary | ICD-10-CM | POA: Diagnosis not present

## 2022-03-26 DIAGNOSIS — J449 Chronic obstructive pulmonary disease, unspecified: Secondary | ICD-10-CM | POA: Diagnosis not present

## 2022-03-26 DIAGNOSIS — C7802 Secondary malignant neoplasm of left lung: Secondary | ICD-10-CM | POA: Diagnosis not present

## 2022-03-26 DIAGNOSIS — C801 Malignant (primary) neoplasm, unspecified: Secondary | ICD-10-CM | POA: Diagnosis not present

## 2022-03-28 DIAGNOSIS — C7802 Secondary malignant neoplasm of left lung: Secondary | ICD-10-CM | POA: Diagnosis not present

## 2022-03-28 DIAGNOSIS — C801 Malignant (primary) neoplasm, unspecified: Secondary | ICD-10-CM | POA: Diagnosis not present

## 2022-03-28 DIAGNOSIS — C7971 Secondary malignant neoplasm of right adrenal gland: Secondary | ICD-10-CM | POA: Diagnosis not present

## 2022-03-28 DIAGNOSIS — J449 Chronic obstructive pulmonary disease, unspecified: Secondary | ICD-10-CM | POA: Diagnosis not present

## 2022-03-28 DIAGNOSIS — C7801 Secondary malignant neoplasm of right lung: Secondary | ICD-10-CM | POA: Diagnosis not present

## 2022-03-28 DIAGNOSIS — C7972 Secondary malignant neoplasm of left adrenal gland: Secondary | ICD-10-CM | POA: Diagnosis not present

## 2022-03-29 DIAGNOSIS — I10 Essential (primary) hypertension: Secondary | ICD-10-CM | POA: Diagnosis not present

## 2022-03-29 DIAGNOSIS — R269 Unspecified abnormalities of gait and mobility: Secondary | ICD-10-CM | POA: Diagnosis not present

## 2022-03-29 DIAGNOSIS — C7802 Secondary malignant neoplasm of left lung: Secondary | ICD-10-CM | POA: Diagnosis not present

## 2022-03-29 DIAGNOSIS — M6281 Muscle weakness (generalized): Secondary | ICD-10-CM | POA: Diagnosis not present

## 2022-03-29 DIAGNOSIS — L03116 Cellulitis of left lower limb: Secondary | ICD-10-CM | POA: Diagnosis not present

## 2022-03-29 DIAGNOSIS — C7972 Secondary malignant neoplasm of left adrenal gland: Secondary | ICD-10-CM | POA: Diagnosis not present

## 2022-03-29 DIAGNOSIS — C7971 Secondary malignant neoplasm of right adrenal gland: Secondary | ICD-10-CM | POA: Diagnosis not present

## 2022-03-29 DIAGNOSIS — J449 Chronic obstructive pulmonary disease, unspecified: Secondary | ICD-10-CM | POA: Diagnosis not present

## 2022-03-29 DIAGNOSIS — C801 Malignant (primary) neoplasm, unspecified: Secondary | ICD-10-CM | POA: Diagnosis not present

## 2022-03-29 DIAGNOSIS — C7801 Secondary malignant neoplasm of right lung: Secondary | ICD-10-CM | POA: Diagnosis not present

## 2022-03-30 DIAGNOSIS — R262 Difficulty in walking, not elsewhere classified: Secondary | ICD-10-CM | POA: Diagnosis not present

## 2022-03-30 DIAGNOSIS — C801 Malignant (primary) neoplasm, unspecified: Secondary | ICD-10-CM | POA: Diagnosis not present

## 2022-03-30 DIAGNOSIS — C7801 Secondary malignant neoplasm of right lung: Secondary | ICD-10-CM | POA: Diagnosis not present

## 2022-03-30 DIAGNOSIS — J449 Chronic obstructive pulmonary disease, unspecified: Secondary | ICD-10-CM | POA: Diagnosis not present

## 2022-03-30 DIAGNOSIS — C7972 Secondary malignant neoplasm of left adrenal gland: Secondary | ICD-10-CM | POA: Diagnosis not present

## 2022-03-30 DIAGNOSIS — L603 Nail dystrophy: Secondary | ICD-10-CM | POA: Diagnosis not present

## 2022-03-30 DIAGNOSIS — C7971 Secondary malignant neoplasm of right adrenal gland: Secondary | ICD-10-CM | POA: Diagnosis not present

## 2022-03-30 DIAGNOSIS — B351 Tinea unguium: Secondary | ICD-10-CM | POA: Diagnosis not present

## 2022-03-30 DIAGNOSIS — C7802 Secondary malignant neoplasm of left lung: Secondary | ICD-10-CM | POA: Diagnosis not present

## 2022-04-01 DIAGNOSIS — C7801 Secondary malignant neoplasm of right lung: Secondary | ICD-10-CM | POA: Diagnosis not present

## 2022-04-01 DIAGNOSIS — J449 Chronic obstructive pulmonary disease, unspecified: Secondary | ICD-10-CM | POA: Diagnosis not present

## 2022-04-01 DIAGNOSIS — C7971 Secondary malignant neoplasm of right adrenal gland: Secondary | ICD-10-CM | POA: Diagnosis not present

## 2022-04-01 DIAGNOSIS — C801 Malignant (primary) neoplasm, unspecified: Secondary | ICD-10-CM | POA: Diagnosis not present

## 2022-04-01 DIAGNOSIS — C7972 Secondary malignant neoplasm of left adrenal gland: Secondary | ICD-10-CM | POA: Diagnosis not present

## 2022-04-01 DIAGNOSIS — C7802 Secondary malignant neoplasm of left lung: Secondary | ICD-10-CM | POA: Diagnosis not present

## 2022-04-02 DIAGNOSIS — C801 Malignant (primary) neoplasm, unspecified: Secondary | ICD-10-CM | POA: Diagnosis not present

## 2022-04-02 DIAGNOSIS — C7802 Secondary malignant neoplasm of left lung: Secondary | ICD-10-CM | POA: Diagnosis not present

## 2022-04-02 DIAGNOSIS — J449 Chronic obstructive pulmonary disease, unspecified: Secondary | ICD-10-CM | POA: Diagnosis not present

## 2022-04-02 DIAGNOSIS — C7801 Secondary malignant neoplasm of right lung: Secondary | ICD-10-CM | POA: Diagnosis not present

## 2022-04-02 DIAGNOSIS — C7972 Secondary malignant neoplasm of left adrenal gland: Secondary | ICD-10-CM | POA: Diagnosis not present

## 2022-04-02 DIAGNOSIS — C7971 Secondary malignant neoplasm of right adrenal gland: Secondary | ICD-10-CM | POA: Diagnosis not present

## 2022-04-04 DIAGNOSIS — G629 Polyneuropathy, unspecified: Secondary | ICD-10-CM | POA: Diagnosis not present

## 2022-04-04 DIAGNOSIS — Z8669 Personal history of other diseases of the nervous system and sense organs: Secondary | ICD-10-CM | POA: Diagnosis not present

## 2022-04-04 DIAGNOSIS — C7802 Secondary malignant neoplasm of left lung: Secondary | ICD-10-CM | POA: Diagnosis not present

## 2022-04-04 DIAGNOSIS — Z85118 Personal history of other malignant neoplasm of bronchus and lung: Secondary | ICD-10-CM | POA: Diagnosis not present

## 2022-04-04 DIAGNOSIS — C7971 Secondary malignant neoplasm of right adrenal gland: Secondary | ICD-10-CM | POA: Diagnosis not present

## 2022-04-04 DIAGNOSIS — Z87891 Personal history of nicotine dependence: Secondary | ICD-10-CM | POA: Diagnosis not present

## 2022-04-04 DIAGNOSIS — C7972 Secondary malignant neoplasm of left adrenal gland: Secondary | ICD-10-CM | POA: Diagnosis not present

## 2022-04-04 DIAGNOSIS — C801 Malignant (primary) neoplasm, unspecified: Secondary | ICD-10-CM | POA: Diagnosis not present

## 2022-04-04 DIAGNOSIS — I10 Essential (primary) hypertension: Secondary | ICD-10-CM | POA: Diagnosis not present

## 2022-04-04 DIAGNOSIS — C7801 Secondary malignant neoplasm of right lung: Secondary | ICD-10-CM | POA: Diagnosis not present

## 2022-04-04 DIAGNOSIS — J9601 Acute respiratory failure with hypoxia: Secondary | ICD-10-CM | POA: Diagnosis not present

## 2022-04-04 DIAGNOSIS — J449 Chronic obstructive pulmonary disease, unspecified: Secondary | ICD-10-CM | POA: Diagnosis not present

## 2022-04-04 DIAGNOSIS — E440 Moderate protein-calorie malnutrition: Secondary | ICD-10-CM | POA: Diagnosis not present

## 2022-04-05 DIAGNOSIS — J449 Chronic obstructive pulmonary disease, unspecified: Secondary | ICD-10-CM | POA: Diagnosis not present

## 2022-04-05 DIAGNOSIS — R1310 Dysphagia, unspecified: Secondary | ICD-10-CM | POA: Diagnosis not present

## 2022-04-05 DIAGNOSIS — C801 Malignant (primary) neoplasm, unspecified: Secondary | ICD-10-CM | POA: Diagnosis not present

## 2022-04-05 DIAGNOSIS — R4181 Age-related cognitive decline: Secondary | ICD-10-CM | POA: Diagnosis not present

## 2022-04-05 DIAGNOSIS — C7801 Secondary malignant neoplasm of right lung: Secondary | ICD-10-CM | POA: Diagnosis not present

## 2022-04-05 DIAGNOSIS — C7802 Secondary malignant neoplasm of left lung: Secondary | ICD-10-CM | POA: Diagnosis not present

## 2022-04-05 DIAGNOSIS — R059 Cough, unspecified: Secondary | ICD-10-CM | POA: Diagnosis not present

## 2022-04-05 DIAGNOSIS — C7972 Secondary malignant neoplasm of left adrenal gland: Secondary | ICD-10-CM | POA: Diagnosis not present

## 2022-04-05 DIAGNOSIS — C7971 Secondary malignant neoplasm of right adrenal gland: Secondary | ICD-10-CM | POA: Diagnosis not present

## 2022-04-06 DIAGNOSIS — C7972 Secondary malignant neoplasm of left adrenal gland: Secondary | ICD-10-CM | POA: Diagnosis not present

## 2022-04-06 DIAGNOSIS — J449 Chronic obstructive pulmonary disease, unspecified: Secondary | ICD-10-CM | POA: Diagnosis not present

## 2022-04-06 DIAGNOSIS — C801 Malignant (primary) neoplasm, unspecified: Secondary | ICD-10-CM | POA: Diagnosis not present

## 2022-04-06 DIAGNOSIS — C7801 Secondary malignant neoplasm of right lung: Secondary | ICD-10-CM | POA: Diagnosis not present

## 2022-04-06 DIAGNOSIS — C7971 Secondary malignant neoplasm of right adrenal gland: Secondary | ICD-10-CM | POA: Diagnosis not present

## 2022-04-06 DIAGNOSIS — C7802 Secondary malignant neoplasm of left lung: Secondary | ICD-10-CM | POA: Diagnosis not present

## 2022-04-07 DIAGNOSIS — C7802 Secondary malignant neoplasm of left lung: Secondary | ICD-10-CM | POA: Diagnosis not present

## 2022-04-07 DIAGNOSIS — C801 Malignant (primary) neoplasm, unspecified: Secondary | ICD-10-CM | POA: Diagnosis not present

## 2022-04-07 DIAGNOSIS — C7801 Secondary malignant neoplasm of right lung: Secondary | ICD-10-CM | POA: Diagnosis not present

## 2022-04-07 DIAGNOSIS — C7972 Secondary malignant neoplasm of left adrenal gland: Secondary | ICD-10-CM | POA: Diagnosis not present

## 2022-04-07 DIAGNOSIS — J449 Chronic obstructive pulmonary disease, unspecified: Secondary | ICD-10-CM | POA: Diagnosis not present

## 2022-04-07 DIAGNOSIS — C7971 Secondary malignant neoplasm of right adrenal gland: Secondary | ICD-10-CM | POA: Diagnosis not present

## 2022-05-04 DEATH — deceased

## 2022-06-07 ENCOUNTER — Ambulatory Visit: Payer: Medicare Other | Admitting: Podiatry
